# Patient Record
Sex: Female | Born: 1995 | Race: Black or African American | Hispanic: No | Marital: Single | Smoking: Never smoker
Health system: Southern US, Community
[De-identification: ages and names within clinical notes are randomized; demographics above are authoritative.]

## PROBLEM LIST (undated history)

## (undated) DIAGNOSIS — Z789 Other specified health status: Secondary | ICD-10-CM

## (undated) HISTORY — PX: NO PAST SURGERIES: SHX2092

## (undated) HISTORY — PX: WISDOM TOOTH EXTRACTION: SHX21

---

## 2013-12-13 ENCOUNTER — Emergency Department: Payer: Self-pay | Admitting: Emergency Medicine

## 2013-12-13 LAB — COMPREHENSIVE METABOLIC PANEL
ALBUMIN: 3.4 g/dL — AB (ref 3.8–5.6)
ALK PHOS: 51 U/L
ALT: 17 U/L (ref 12–78)
Anion Gap: 14 (ref 7–16)
BUN: 11 mg/dL (ref 7–18)
Bilirubin,Total: 0.3 mg/dL (ref 0.2–1.0)
CHLORIDE: 106 mmol/L (ref 98–107)
Calcium, Total: 8.5 mg/dL — ABNORMAL LOW (ref 9.0–10.7)
Co2: 27 mmol/L (ref 21–32)
Creatinine: 0.7 mg/dL (ref 0.60–1.30)
GLUCOSE: 92 mg/dL (ref 65–99)
Osmolality: 291 (ref 275–301)
POTASSIUM: 3.7 mmol/L (ref 3.5–5.1)
SGOT(AST): 25 U/L (ref 0–26)
SODIUM: 147 mmol/L — AB (ref 136–145)
TOTAL PROTEIN: 7.1 g/dL (ref 6.4–8.6)

## 2013-12-13 LAB — CBC
HCT: 33.5 % — ABNORMAL LOW (ref 35.0–47.0)
HGB: 11.5 g/dL — ABNORMAL LOW (ref 12.0–16.0)
MCH: 30.2 pg (ref 26.0–34.0)
MCHC: 34.4 g/dL (ref 32.0–36.0)
MCV: 88 fL (ref 80–100)
Platelet: 252 10*3/uL (ref 150–440)
RBC: 3.82 10*6/uL (ref 3.80–5.20)
RDW: 13.8 % (ref 11.5–14.5)
WBC: 8.2 10*3/uL (ref 3.6–11.0)

## 2013-12-13 LAB — URINALYSIS, COMPLETE
Bilirubin,UR: NEGATIVE
GLUCOSE, UR: NEGATIVE mg/dL (ref 0–75)
Ketone: NEGATIVE
NITRITE: NEGATIVE
PH: 5 (ref 4.5–8.0)
Protein: 30
Specific Gravity: 1.025 (ref 1.003–1.030)
WBC UR: 14 /HPF (ref 0–5)

## 2013-12-13 LAB — GC/CHLAMYDIA PROBE AMP

## 2013-12-13 LAB — WET PREP, GENITAL

## 2013-12-13 LAB — HCG, QUANTITATIVE, PREGNANCY: Beta Hcg, Quant.: 621 m[IU]/mL — ABNORMAL HIGH

## 2014-10-27 ENCOUNTER — Emergency Department: Payer: Self-pay | Admitting: Emergency Medicine

## 2015-02-06 IMAGING — US US OB < 14 WEEKS - US OB TV
1 series · 14 of 28 positions shown · non-contrast
Comparison: None.

CLINICAL DATA: Abortion last week. Pelvic pain. Evaluate for
retained products of conception.

EXAM:
OBSTETRIC <14 WK US AND TRANSVAGINAL OB US
TECHNIQUE: Both transabdominal and transvaginal ultrasound examinations were
performed for complete evaluation of the gestation as well as the
maternal uterus, adnexal regions, and pelvic cul-de-sac.
Transvaginal technique was performed to assess early pregnancy.

[Series 1: us ob < 14 weeks - us ob tv · 0.30mm/px · 14 of 123 slices shown]
[im 5/123]
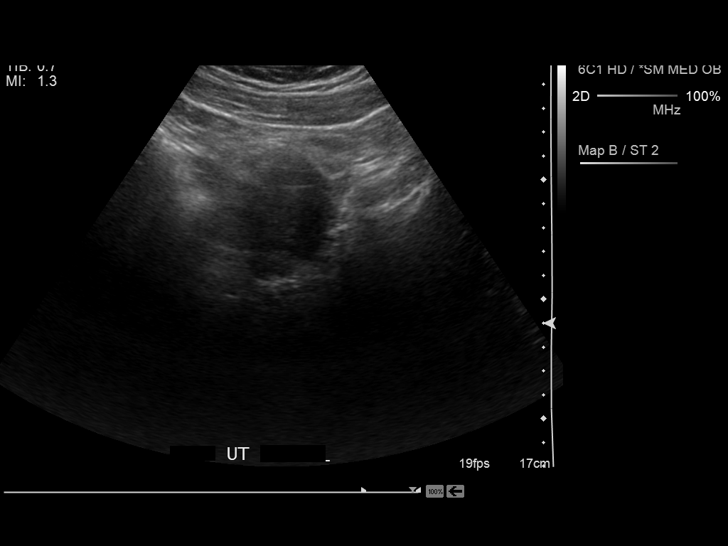
[im 14/123]
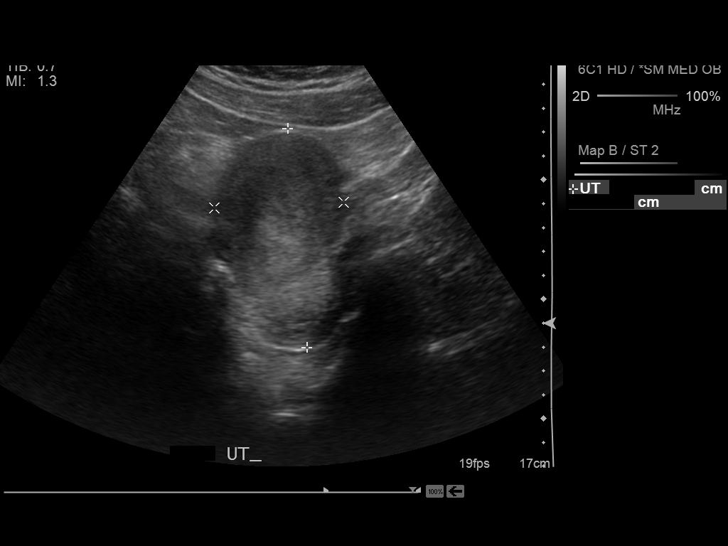
[im 23/123]
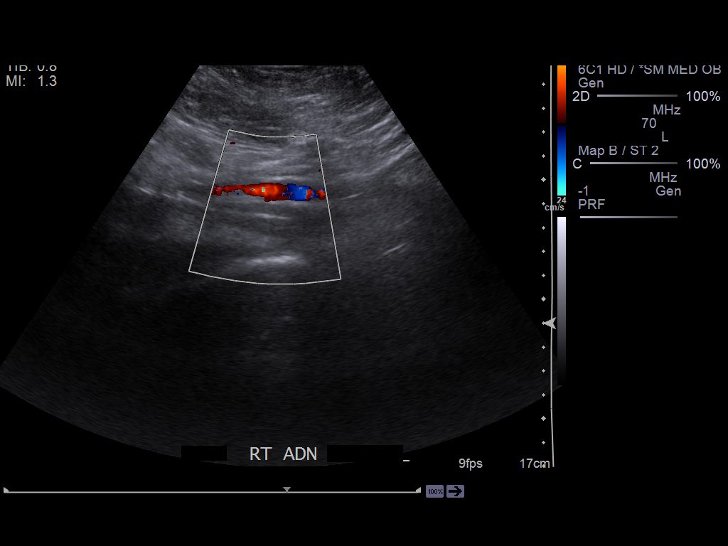
[im 32/123]
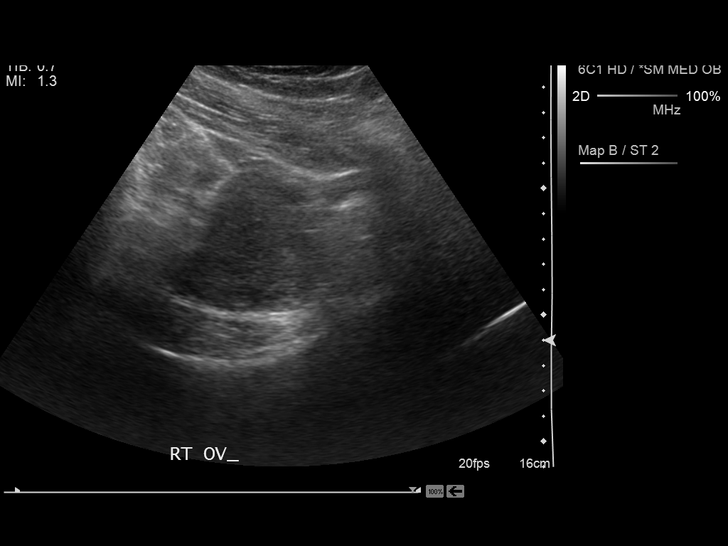
[im 41/123]
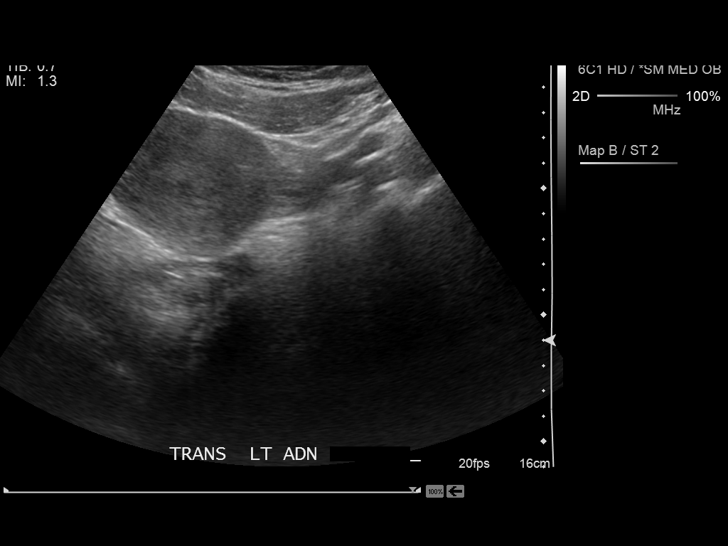
[im 50/123]
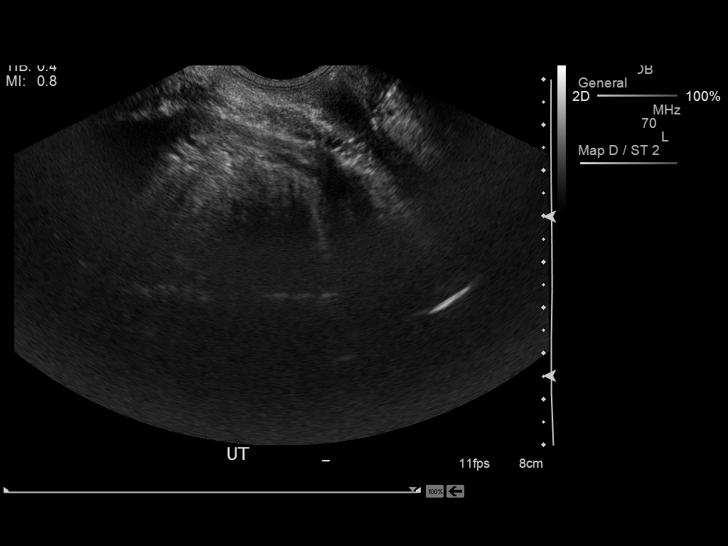
[im 59/123]
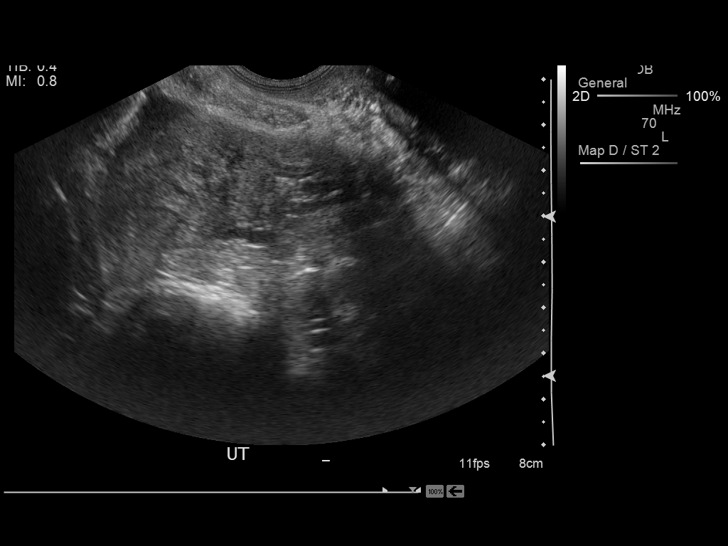
[im 68/123]
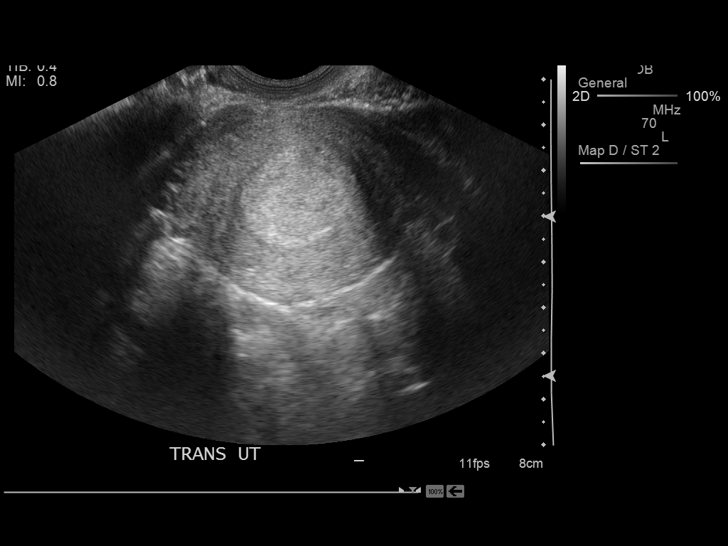
[im 77/123]
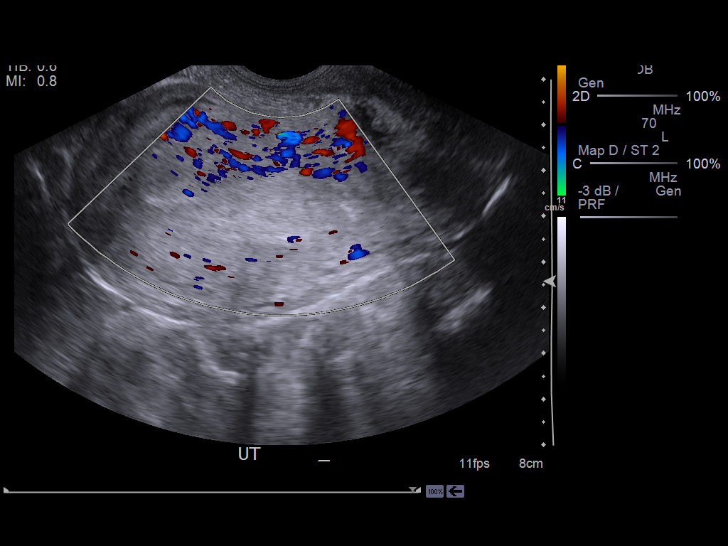
[im 86/123]
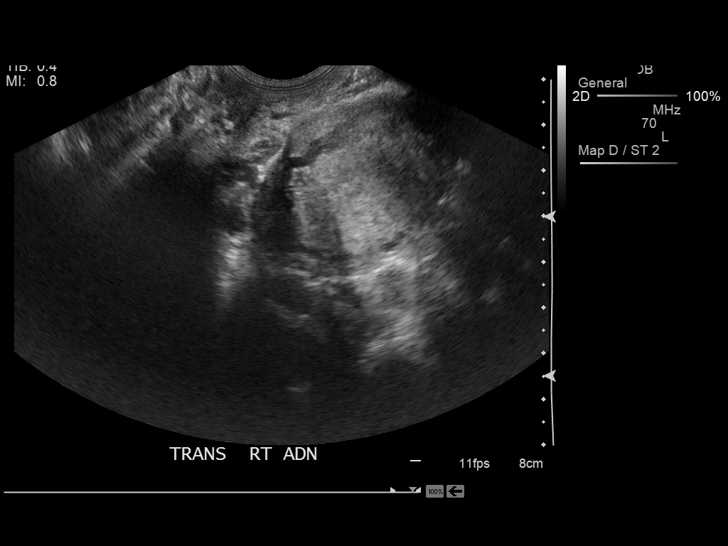
[im 95/123]
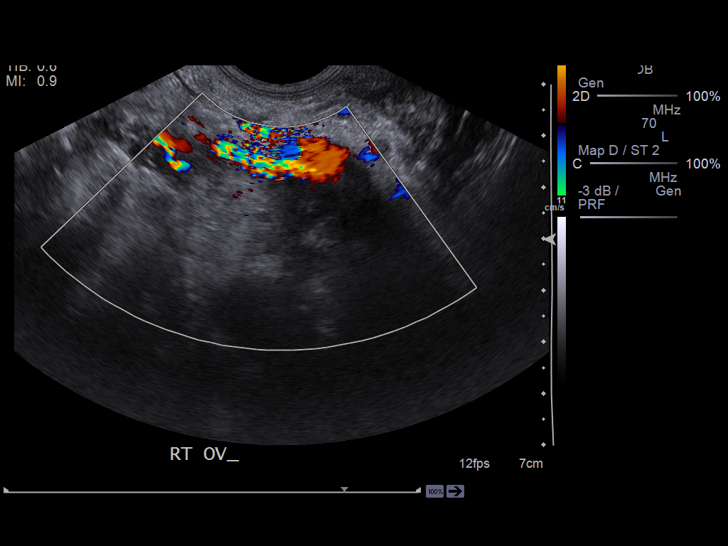
[im 104/123]
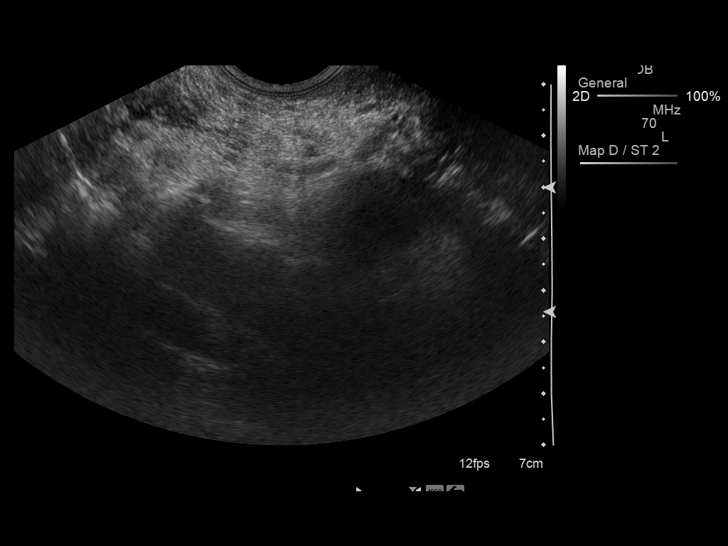
[im 113/123]
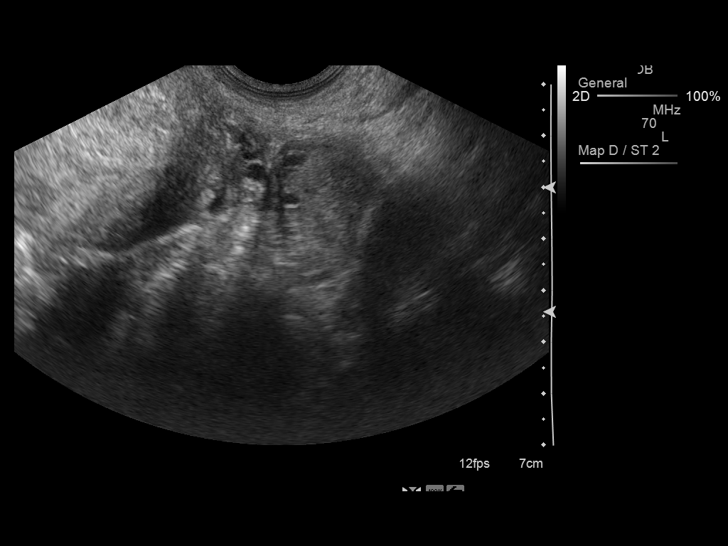
[im 123/123]
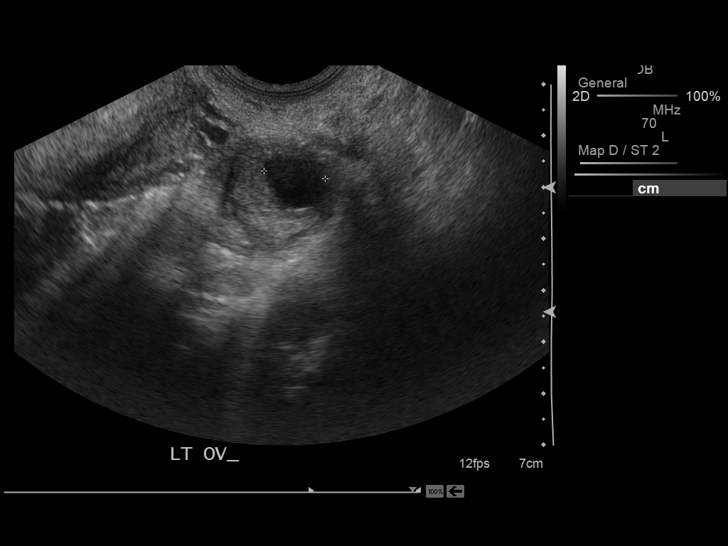

[14 of 28 positions shown; findings below may reference images not displayed]

FINDINGS: Intrauterine gestational sac: Absent

Yolk sac:  Not visualized

Embryo:  Not visualized

Maternal uterus/adnexae: Endometrium is thickened and heterogeneous.
Internal blood flow noted within the endometrium. Findings
concerning for retained products of conception. Endometrial
thickness 18 mm.

Ovaries are symmetric in size and echotexture. No adnexal masses.
Trace free fluid in the cul-de-sac.
IMPRESSION: No intrauterine pregnancy.

Thickened, heterogeneous endometrium with internal blood flow.
Cannot exclude retained products of conception.

## 2015-09-06 ENCOUNTER — Encounter: Payer: Self-pay | Admitting: *Deleted

## 2015-09-06 ENCOUNTER — Emergency Department: Payer: Self-pay

## 2015-09-06 ENCOUNTER — Emergency Department
Admission: EM | Admit: 2015-09-06 | Discharge: 2015-09-06 | Disposition: A | Discharge status: Home / Self Care | Payer: Self-pay | Attending: Emergency Medicine | Admitting: Emergency Medicine

## 2015-09-06 DIAGNOSIS — G8929 Other chronic pain: Secondary | ICD-10-CM | POA: Insufficient documentation

## 2015-09-06 DIAGNOSIS — M25562 Pain in left knee: Secondary | ICD-10-CM | POA: Insufficient documentation

## 2015-09-06 MED ORDER — NAPROXEN SODIUM 275 MG PO TABS: 275.0000 mg | ORAL_TABLET | Freq: Two times a day (BID) | ORAL | Status: AC

## 2015-09-06 NOTE — ED Notes (Signed)
Left knee pain x several years, states that the pain worsened 2 days ago.  Ambulatory.

## 2015-09-06 NOTE — Discharge Instructions (Signed)

## 2015-09-06 NOTE — ED Notes (Signed)
Pt dc home ambulatory with crutches instructed on follow up and med use PT NAD AT DC

## 2015-09-06 NOTE — ED Provider Notes (Addendum)
[facility] Emergency Department Provider Note  ____________________________________________   I have reviewed the triage vital signs and the nursing notes.   HISTORY  Chief Complaint Knee Pain    HPI [name] is a 19 y.o. female with a unfortunate history of morbid obesity who has had knee pain for many years. She was once told she had [employer] synd[employer]ewho presents today complaining of a "flare" in her chronic pain in her left knee. She has had it x-rayed before. She has not fallen or had trauma. She works at [employer] standing up and sometimes her knee aches. She has had no fever or chills or systemic illness. This is the same pain she has had many times before. The joint is not red or hot or swollen.  History reviewed. No pertinent past medical history.  There are no active problems to display for this patient.   History reviewed. No pertinent past surgical history.  No current outpatient prescriptions on file.  Allergies Review of patient's allergies indicates no known allergies.  History reviewed. No pertinent family history.  Social History Social History  Substance Use Topics  . Smoking status: Never Smoker   . Smokeless tobacco: None  . Alcohol Use: No    Review of Systems Constitutional: No fever/chills Eyes: No visual changes. ENT: No sore throat. No stiff neck no neck pain Cardiovascular: Denies chest pain. Respiratory: Denies shortness of breath. Gastrointestinal:   no vomiting.  No diarrhea.  No constipation. Genitourinary: Negative for dysuria. Musculoskeletal: Negative lower extremity swelling Skin: Negative for rash. Neurological: Negative for headaches, focal weakness or numbness. 10-point ROS otherwise negative.  ____________________________________________   PHYSICAL EXAM:  VITAL SIGNS: ED Triage Vitals  Enc Vitals Group     BP 09/06/15 2020 122/76 mmHg     Pulse Rate 09/06/15 2020 86     Resp  09/06/15 2020 18     Temp 09/06/15 2020 98.5 F (36.9 C)     Temp Source 09/06/15 2020 Oral     SpO2 09/06/15 2020 99 %     Weight 09/06/15 2020 280 lb (127.007 kg)     Height 09/06/15 2020 5\' 5"  (1.651 m)     Head Cir --      Peak Flow --      Pain Score 09/06/15 2019 8     Pain Loc --      Pain Edu? --      Excl. in GC? --     Constitutional: Alert and oriented. Well appearing and in no acute distress. Back:  There is no focal tenderness or step off there is no midline tenderness there are no lesions noted. there is no CVA tenderness Musculoskeletal: No lower extremity tenderness. No joint effusions, no DVT signs strong distal pulses no edema  Left lower showed me shows minimal tenderness to palpation of the left knee with no obvious effusion, full painless range of motion, there is no joint instability on varus or valgus stress, but is not red or hot to touch or otherwise in appearance infected. There is no calf tenderness ankle pain or tenderness or hip pain or tenderness there is nothing to suggest occult fracture there is nothing to suggest joint infection there is nothing to suggest DVT Neurologic:  Normal speech and language. No gross focal neurologic deficits are appreciated.  Skin:  Skin is warm, dry and intact. No rash noted. Psychiatric: Mood and affect are normal. Speech and behavior are normal.  ____________________________________________   LABS (  all labs ordered are listed, but only abnormal results are displayed)  Labs Reviewed - No data to display ____________________________________________  EKG  I personally interpreted any EKGs ordered by me or triage  ____________________________________________  RADIOLOGY  I reviewed any imaging ordered by me or triage that were performed during my shift ____________________________________________   PROCEDURES  Procedure(s) performed: None  Critical Care performed:  None  ____________________________________________   INITIAL IMPRESSION / ASSESSMENT AND PLAN / ED COURSE  Pertinent labs & imaging results that were available during my care of the patient were reviewed by me and considered in my medical decision making (see chart for details).  Patient with chronic knee pain, I do not think a knee immobilizer would help her given there is no instability or injury, I've advised her to lose weight as I think that may help her pain, there is nothing at this time to suggest infected joint DVT or gout. This is not in any way that I can detect referred pain from her hip or other sources. We will advise nostril pain medication I'll give her a note for work and we will try an Ace bandage to see if that helps her. Return precautions and follow-up given and understood.  ----------------------------------------- 9:44 PM on 09/06/2015 -----------------------------------------  Refuses crutches ____________________________________________   FINAL CLINICAL IMPRESSION(S) / ED DIAGNOSES  Final diagnoses:  None     Jeanmarie Plant, MD 09/06/15 2134  Jeanmarie Plant, MD 09/06/15 2144

## 2016-10-30 IMAGING — CR DG KNEE COMPLETE 4+V*L*
1 series · 4 of 4 positions shown · non-contrast
Comparison: None.

CLINICAL DATA: Acute onset of left knee pain.  Initial encounter.

EXAM:
LEFT KNEE - COMPLETE 4+ VIEW

[Series 1: t knee ap left · 0.14mm/px · 4 of 4 slices shown]
[im 1/4]
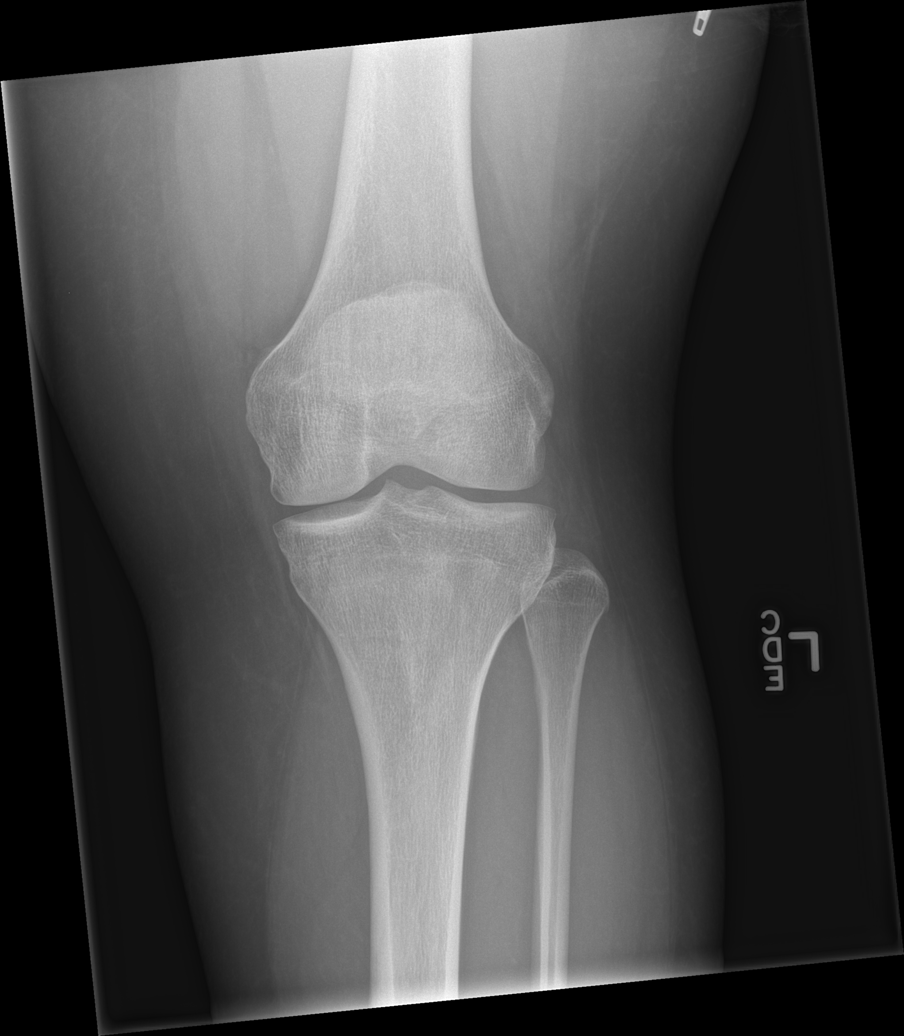
[im 2/4]
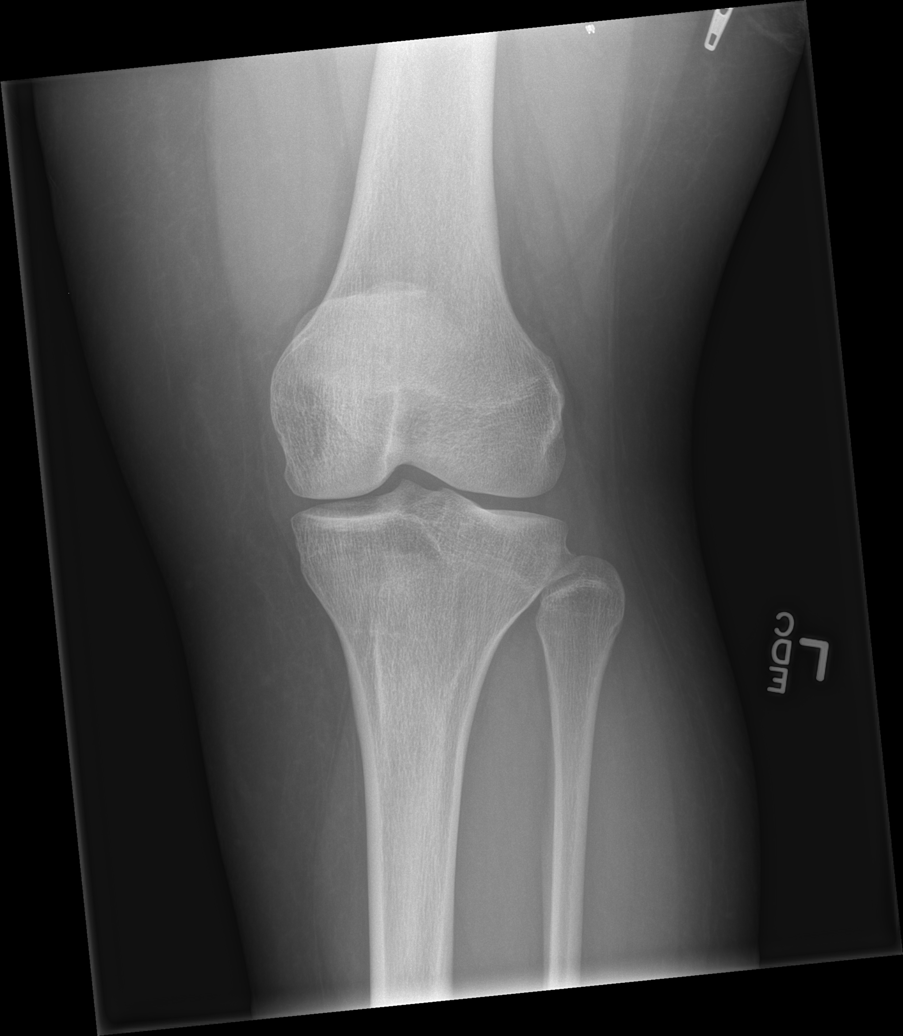
[im 3/4]
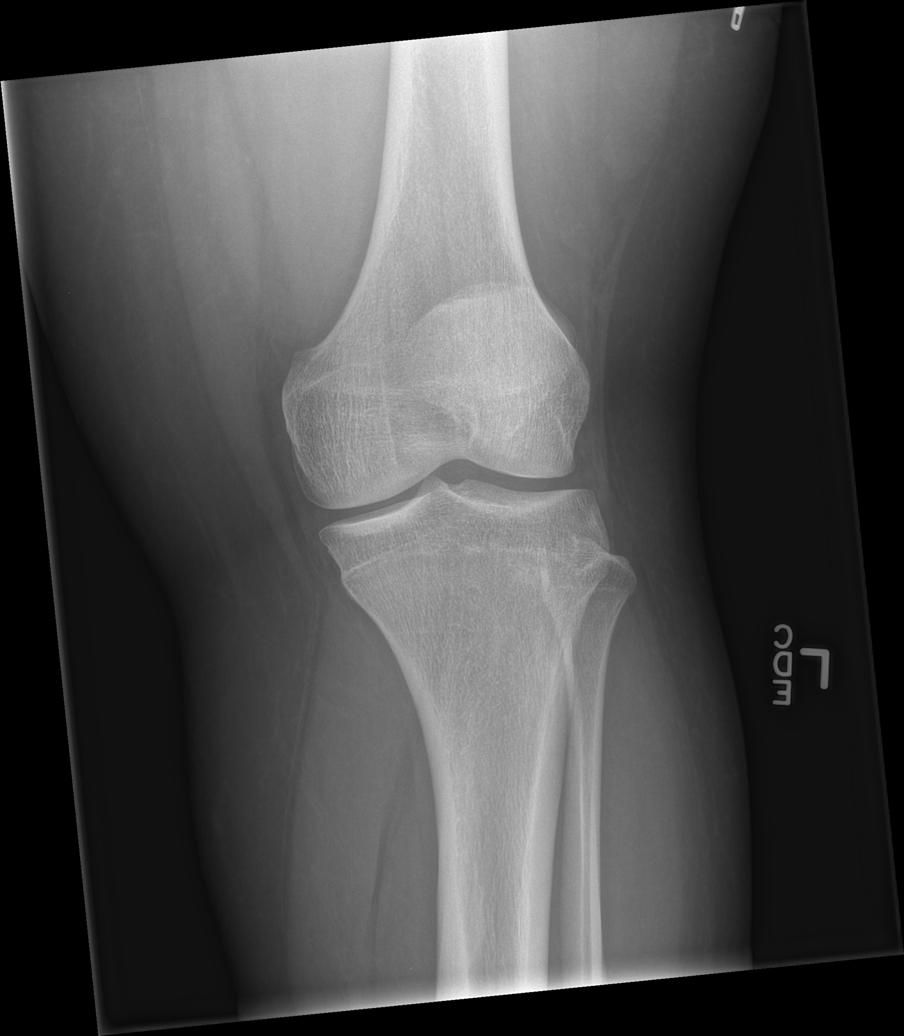
[im 4/4]
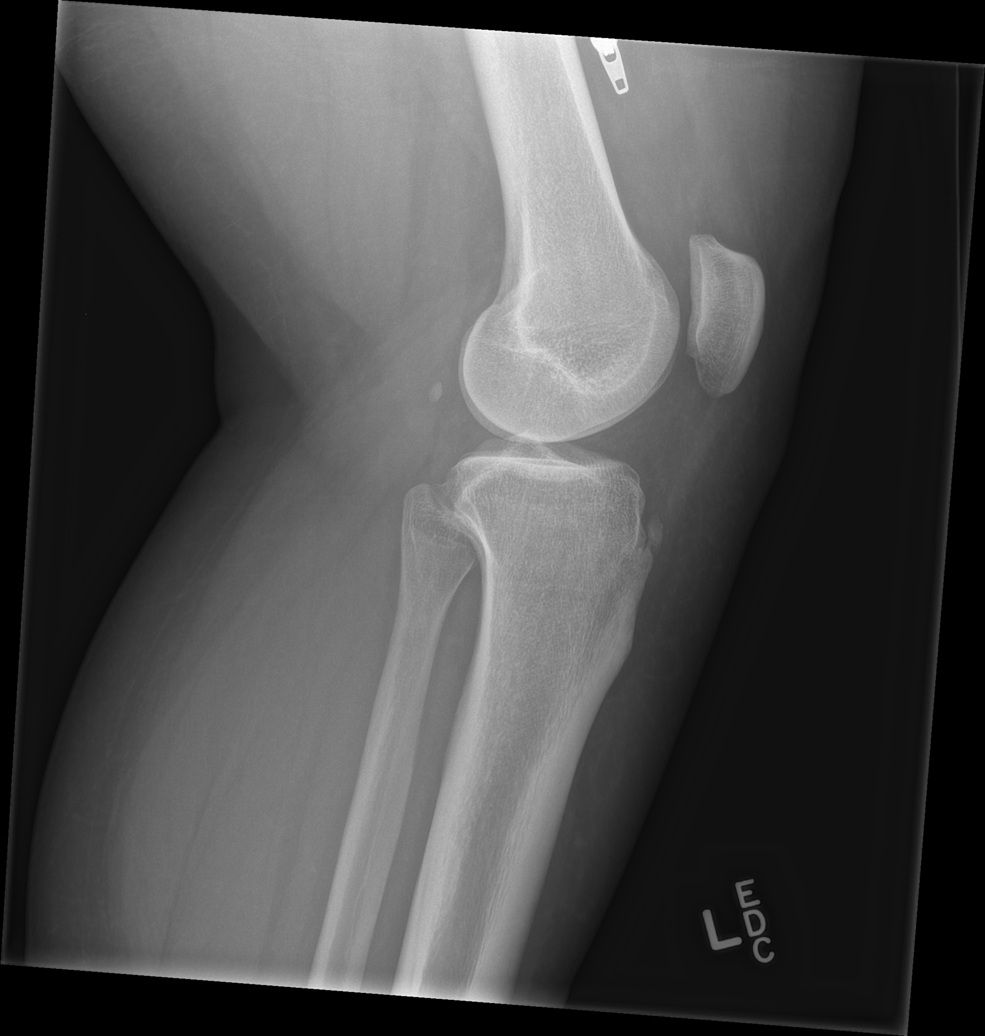

[4 of 4 positions shown; findings below may reference images not displayed]

FINDINGS: There is no evidence of fracture or dislocation. The joint spaces
are preserved. No significant degenerative change is seen; the
patellofemoral joint is grossly unremarkable in appearance. A
fabella is noted. An accessory ossicle is seen at the distal
patellar tendon.

No significant joint effusion is seen. The visualized soft tissues
are normal in appearance.
IMPRESSION: No evidence of fracture or dislocation.

## 2016-12-12 ENCOUNTER — Encounter: Payer: Self-pay | Admitting: Emergency Medicine

## 2016-12-12 ENCOUNTER — Emergency Department
Admission: EM | Admit: 2016-12-12 | Discharge: 2016-12-12 | Disposition: A | Discharge status: Home / Self Care | Payer: Self-pay | Attending: Student in an Organized Health Care Education/Training Program | Admitting: Student in an Organized Health Care Education/Training Program

## 2016-12-12 DIAGNOSIS — H6123 Impacted cerumen, bilateral: Secondary | ICD-10-CM | POA: Insufficient documentation

## 2016-12-12 NOTE — Discharge Instructions (Signed)
Today, your ears were flushed with normal saline and disimpacted successfully. Please avoid Q-tips and flush your ears several times a week with warm water and/or hydrogen peroxide. Return to the ER for any fevers worsening symptoms urgent changes in her health.

## 2016-12-12 NOTE — ED Triage Notes (Signed)
Increasing hearing loss both ears x 2 days.

## 2016-12-12 NOTE — ED Provider Notes (Signed)
[facility]-EMERGENCY DEPARTMENT Provider Note   CSN: [id number] Arrival date & time: 12/12/16  1644     History   Chief Complaint Chief Complaint  Patient presents with  . Hearing Loss    HPI Sim BoastShalena Castro is a 21 y.o. female presents to the emergency department for evaluation of bilateral ear hearing loss. Symptoms began 2 days ago. She denies any congestion, runny nose, fevers, pain, headaches, vision changes, dizziness. She denies any drainage throughout both the ears. She feels well but has difficulty hearing out of both ears.  HPI  History reviewed. No pertinent past medical history.  There are no active problems to display for this patient.   History reviewed. No pertinent surgical history.  OB History    No data available       Home Medications    Prior to Admission medications   Not on File    Family History No family history on file.  Social History Social History  Substance Use Topics  . Smoking status: Never Smoker  . Smokeless tobacco: Not on file  . Alcohol use No     Allergies   Patient has no known allergies.   Review of Systems Review of Systems  Constitutional: Negative for activity change, chills, fatigue and fever.  HENT: Positive for hearing loss. Negative for congestion, sinus pressure and sore throat.   Eyes: Negative for visual disturbance.  Respiratory: Negative for cough, chest tightness and shortness of breath.   Gastrointestinal: Negative for abdominal pain, diarrhea, nausea and vomiting.  Genitourinary: Negative for dysuria.  Musculoskeletal: Negative for arthralgias and gait problem.  Skin: Negative for rash.  Neurological: Negative for weakness, numbness and headaches.  Hematological: Negative for adenopathy.  Psychiatric/Behavioral: Negative for agitation, behavioral problems and confusion.     Physical Exam Updated Vital Signs BP 131/82   Pulse 82   Temp 97.8 F (36.6 C) (Oral)   Resp 20   Ht 5\' 5"  (1.651 m)    Wt 132.5 kg   LMP 12/05/2016   SpO2 100%   BMI 48.59 kg/m   Physical Exam  Constitutional: She is oriented to person, place, and time. She appears well-developed and well-nourished. No distress.  HENT:  Head: Normocephalic and atraumatic.  Right Ear: No drainage. Tympanic membrane is not injected, not perforated and not erythematous. Decreased hearing is noted.  Left Ear: No drainage. Tympanic membrane is not injected, not perforated and not erythematous. Decreased hearing is noted.  Mouth/Throat: Oropharynx is clear and moist.  Bilateral ear cerumen impactions, complete occlusion with no signs of any infection.  Eyes: EOM are normal. Pupils are equal, round, and reactive to light. Right eye exhibits no discharge. Left eye exhibits no discharge.  Neck: Normal range of motion. Neck supple.  Cardiovascular: Normal rate.   Pulmonary/Chest: Effort normal. No respiratory distress.  Musculoskeletal: Normal range of motion. She exhibits no edema.  Neurological: She is alert and oriented to person, place, and time. She has normal reflexes.  Skin: Skin is warm and dry.  Psychiatric: She has a normal mood and affect. Her behavior is normal. Thought content normal.     ED Treatments / Results  Labs (all labs ordered are listed, but only abnormal results are displayed) Labs Reviewed - No data to display  EKG  EKG Interpretation None       Radiology No results found.  Procedures Procedures (including critical care time) Left and right ears noted to have cerumen impactions. Ceruminosis is noted.  Wax is removed bilaterally  by syringing and manual debridement. Instructions for home care to prevent wax buildup are given.    Medications Ordered in ED Medications - No data to display   Initial Impression / Assessment and Plan / ED Course  I have reviewed the triage vital signs and the nursing notes.  Pertinent labs & imaging results that were available during my care of the  patient were reviewed by me and considered in my medical decision making (see chart for details).     21 year old female with bilateral cerumen impaction. Both ears completely disimpacted cerumen removed. There is no signs of infection. Both tympanic membranes visualized and appear to be normal. She is shown how to properly cleansed the ears  Final Clinical Impressions(s) / ED Diagnoses   Final diagnoses:  Bilateral hearing loss due to cerumen impaction    New Prescriptions New Prescriptions   No medications on file     Evon Slack, PA-C 12/12/16 1743    Willy Eddy, MD 12/12/16 [phone]

## 2018-08-15 ENCOUNTER — Emergency Department
Admission: EM | Admit: 2018-08-15 | Discharge: 2018-08-15 | Disposition: A | Discharge status: Home / Self Care | Payer: Self-pay | Attending: Emergency Medicine | Admitting: Emergency Medicine

## 2018-08-15 ENCOUNTER — Encounter: Payer: Self-pay | Admitting: Emergency Medicine

## 2018-08-15 ENCOUNTER — Other Ambulatory Visit: Payer: Self-pay

## 2018-08-15 DIAGNOSIS — J069 Acute upper respiratory infection, unspecified: Secondary | ICD-10-CM | POA: Insufficient documentation

## 2018-08-15 MED ORDER — PSEUDOEPH-BROMPHEN-DM 30-2-10 MG/5ML PO SYRP: 5.0000 mL | ORAL_SOLUTION | Freq: Four times a day (QID) | ORAL | 0 refills | Status: DC | PRN

## 2018-08-15 NOTE — ED Provider Notes (Signed)
[facility] Emergency Department Provider Note  ____________________________________________   First MD Initiated Contact with Patient 08/15/18 1624     (approximate)  I have reviewed the triage vital signs and the nursing notes.   HISTORY  Chief Complaint Nasal Congestion   HPI [name] is a 22 y.o. female presents to the ED with complaint of nasal congestion for the last 3 days.  There is been no fever, chills, nausea or vomiting.  Patient has taken Tylenol with minimal relief.  Patient continues to eat and drink as normal.  She rates her pain as an 8 out of 10.   History reviewed. No pertinent past medical history.  There are no active problems to display for this patient.   History reviewed. No pertinent surgical history.  Prior to Admission medications   Medication Sig Start Date End Date Taking? Authorizing Provider  brompheniramine-pseudoephedrine-DM 30-2-10 MG/5ML syrup Take 5 mLs by mouth 4 (four) times daily as needed. 08/15/18   Tommi Rumps, PA-C    Allergies Patient has no known allergies.  No family history on file.  Social History Social History   Tobacco Use  . Smoking status: Never Smoker  Substance Use Topics  . Alcohol use: No  . Drug use: No    Review of Systems Constitutional: No fever/chills Eyes: No visual changes. ENT: No sore throat.  Positive nasal congestion. Cardiovascular: Denies chest pain. Respiratory: Denies shortness of breath.  Positive cough. Gastrointestinal: No abdominal pain.  No nausea, no vomiting.  No diarrhea.  No constipation. Musculoskeletal: Negative for back pain. Skin: Negative for rash. Neurological: Negative for headaches, focal weakness or numbness. ____________________________________________   PHYSICAL EXAM:  VITAL SIGNS: ED Triage Vitals [08/15/18 1545]  Enc Vitals Group     BP 111/63     Pulse Rate 70     Resp 18     Temp 98.1 F (36.7 C)     Temp Source  Oral     SpO2 98 %     Weight 285 lb (129.3 kg)     Height 5\' 5"  (1.651 m)     Head Circumference      Peak Flow      Pain Score 8     Pain Loc      Pain Edu?      Excl. in GC?    Constitutional: Alert and oriented. Well appearing and in no acute distress. Eyes: Conjunctivae are normal. PERRL. EOMI. Head: Atraumatic. Nose: Moderate congestion/clear rhinnorhea.  EACs and TMs clear bilaterally. Mouth/Throat: Mucous membranes are moist.  Oropharynx non-erythematous.  Uvula is midline. Neck: No stridor.   Hematological/Lymphatic/Immunilogical: No cervical lymphadenopathy. Cardiovascular: Normal rate, regular rhythm. Grossly normal heart sounds.  Good peripheral circulation. Respiratory: Normal respiratory effort.  No retractions. Lungs CTAB. Gastrointestinal: Soft and nontender. No distention.  Musculoskeletal: Moves upper and lower extremities then difficulty normal gait was noted. Neurologic:  Normal speech and language. No gross focal neurologic deficits are appreciated. No gait instability. Skin:  Skin is warm, dry and intact. No rash noted. Psychiatric: Mood and affect are normal. Speech and behavior are normal.  ____________________________________________   LABS (all labs ordered are listed, but only abnormal results are displayed)  Labs Reviewed - No data to display PROCEDURES  Procedure(s) performed: None  Procedures  Critical Care performed: No  ____________________________________________   INITIAL IMPRESSION / ASSESSMENT AND PLAN / ED COURSE  As part of my medical decision making, I reviewed the following data within the  electronic MEDICAL RECORD NUMBER Notes from prior ED visits and [place] Controlled Substance Database  Patient presents with complaint of nasal congestion for the last 3 days.  Patient has been taking Tylenol without any relief.  She denies any fever, chills, nausea or vomiting.  Physical exam is consistent with a upper respiratory infection.  Patient was  given a prescription for Bromfed-DM to take 4 times daily as needed.  She is to follow-up with [facility] acute care if any continued problems. ____________________________________________   FINAL CLINICAL IMPRESSION(S) / ED DIAGNOSES  Final diagnoses:  Upper respiratory tract infection, unspecified type     ED Discharge Orders         Ordered    brompheniramine-pseudoephedrine-DM 30-2-10 MG/5ML syrup  4 times daily PRN     08/15/18 1640           Note:  This document was prepared using Dragon voice recognition software and may include unintentional dictation errors.    Tommi Rumps, PA-C 08/15/18 1704    Nita Sickle, MD 08/16/18 [phone]

## 2018-08-15 NOTE — Discharge Instructions (Signed)
Follow-up with [facility] acute care if any continued problems.  Begin taking Bromfed-DM as needed for cough, congestion and nasal drainage.  Increase fluids.  You may also take Tylenol or ibuprofen if needed for throat pain or ear pain.

## 2018-08-15 NOTE — ED Triage Notes (Signed)
Congestion x 3 days

## 2018-11-06 ENCOUNTER — Other Ambulatory Visit: Payer: Self-pay

## 2018-11-06 ENCOUNTER — Emergency Department
Admission: EM | Admit: 2018-11-06 | Discharge: 2018-11-06 | Disposition: A | Discharge status: Home / Self Care | Payer: Self-pay | Attending: Emergency Medicine | Admitting: Emergency Medicine

## 2018-11-06 DIAGNOSIS — J020 Streptococcal pharyngitis: Secondary | ICD-10-CM | POA: Insufficient documentation

## 2018-11-06 LAB — GROUP A STREP BY PCR: Group A Strep by PCR: DETECTED — AB

## 2018-11-06 MED ORDER — FLUCONAZOLE 150 MG PO TABS: 150.0000 mg | ORAL_TABLET | Freq: Once | ORAL | 0 refills | Status: AC

## 2018-11-06 MED ORDER — AMOXICILLIN 500 MG PO CAPS
500.0000 mg | ORAL_CAPSULE | Freq: Once | ORAL | Status: AC
  Administered 2018-11-06: 500 mg via ORAL
  Filled 2018-11-06: qty 1

## 2018-11-06 MED ORDER — AMOXICILLIN 500 MG PO TABS: 500.0000 mg | ORAL_TABLET | Freq: Three times a day (TID) | ORAL | 0 refills | Status: DC

## 2018-11-06 NOTE — ED Notes (Signed)
See triage note  Presents with sore throat  Pain started couple of days ago  Increased pain with swallowing  No fever

## 2018-11-06 NOTE — ED Provider Notes (Signed)
[facility] Emergency Department Provider Note  ____________________________________________  Time seen: Approximately 10:32 PM  I have reviewed the triage vital signs and the nursing notes.   HISTORY  Chief Complaint Sore Throat    HPI [name] is a 23 y.o. female presents to the emergency department for treatment and evaluation of  sore throat that started a couple of days ago.  She has had an increase in pain today.  She denies fever or other symptoms of concern.  No alleviating measures attempted.  History reviewed. No pertinent past medical history.  There are no active problems to display for this patient.   History reviewed. No pertinent surgical history.  Prior to Admission medications   Medication Sig Start Date End Date Taking? Authorizing Provider  amoxicillin (AMOXIL) 500 MG tablet Take 1 tablet (500 mg total) by mouth 3 (three) times daily. 11/06/18   Aloys Hupfer, Rulon Eisenmenger B, FNP  fluconazole (DIFLUCAN) 150 MG tablet Take 1 tablet (150 mg total) by mouth once for 1 dose. 11/06/18 11/06/18  Chinita Pester, FNP    Allergies Patient has no known allergies.  No family history on file.  Social History Social History   Tobacco Use  . Smoking status: Never Smoker  Substance Use Topics  . Alcohol use: No  . Drug use: No    Review of Systems Constitutional: Negative for fever. Eyes: No visual changes. ENT: Positive for sore throat; negative for difficulty swallowing. Respiratory: Denies shortness of breath. Gastrointestinal: Negative for abdominal pain.  No nausea, no vomiting.  No diarrhea.  Genitourinary: Negative for dysuria.  Negative for decrease in need to void. Musculoskeletal: Negative for generalized body aches. Skin: Negative for rash. Neurological: Negative for headaches, negative for focal weakness or numbness.  ____________________________________________   PHYSICAL EXAM:  VITAL SIGNS: ED Triage Vitals  Enc Vitals  Group     BP 11/06/18 1731 113/75     Pulse Rate 11/06/18 1731 85     Resp 11/06/18 1731 18     Temp 11/06/18 1731 98.3 F (36.8 C)     Temp src --      SpO2 11/06/18 1731 100 %     Weight 11/06/18 1732 285 lb (129.3 kg)     Height 11/06/18 1732 5\' 6"  (1.676 m)     Head Circumference --      Peak Flow --      Pain Score 11/06/18 1732 8     Pain Loc --      Pain Edu? --      Excl. in GC? --     Constitutional: Alert and oriented. Well appearing and in no acute distress. Eyes: Conjunctivae are normal.  Head: Atraumatic. Nose: No congestion/rhinnorhea. Mouth/Throat: Mucous membranes are moist.  Oropharynx erythematous, tonsils 2+ with exudate. Uvula is midline. Ears: Right tympanic membrane appears normal.  Left tympanic membrane appears normal. Neck: No stridor. Voice clear Lymphatic: Anterior cervical nodes tender and palpable bilaterally Cardiovascular: Normal rate, regular rhythm. Good peripheral circulation. Respiratory: Normal respiratory effort. Lungs CTAB. Gastrointestinal: Soft and nontender. Musculoskeletal: FROM of neck, upper and lower extremities. Neurologic:  Normal speech and language. No gross focal neurologic deficits are appreciated. Skin:  Skin is warm, dry and intact.  No rash noted Psychiatric: Mood and affect are normal. Speech and behavior are normal.  ____________________________________________   LABS (all labs ordered are listed, but only abnormal results are displayed)  Labs Reviewed  GROUP A STREP BY PCR - Abnormal; Notable for the following  components:      Result Value   Group A Strep by PCR DETECTED (*)    All other components within normal limits   ____________________________________________  EKG  Not indiated ____________________________________________  RADIOLOGY  Not indicated ____________________________________________   PROCEDURES  Procedure(s) performed: None  Critical Care performed:  No ____________________________________________   INITIAL IMPRESSION / ASSESSMENT AND PLAN / ED COURSE  23 year old female presenting to the emergency department for treatment and evaluation of sore throat.  Strep by PCR testing is positive.  She will be treated with amoxicillin and is to take Tylenol or ibuprofen if needed for pain.  She also requested Diflucan in case she gets yeast infection secondary to the antibiotics which was provided.  She was encouraged to follow-up with her primary care provider of her choice for symptoms that are not improving over the next few days.  She was encouraged to return to the emergency department for symptoms of change or worsen if unable to schedule appointment.  Pertinent labs & imaging results that were available during my care of the patient were reviewed by me and considered in my medical decision making (see chart for details). ____________________________________________  Discharge Medication List as of 11/06/2018  7:14 PM    START taking these medications   Details  amoxicillin (AMOXIL) 500 MG tablet Take 1 tablet (500 mg total) by mouth 3 (three) times daily., Starting Sun 11/06/2018, Normal    fluconazole (DIFLUCAN) 150 MG tablet Take 1 tablet (150 mg total) by mouth once for 1 dose., Starting Sun 11/06/2018, Normal        FINAL CLINICAL IMPRESSION(S) / ED DIAGNOSES  Final diagnoses:  Strep throat    If controlled substance prescribed during this visit, 12 month history viewed on the [facility] prior to issuing an initial prescription for Schedule II or III opiod.   Note:  This document was prepared using Dragon voice recognition software and may include unintentional dictation errors.    Chinita Pesterriplett, Emalie Mcwethy B, FNP 11/06/18 2234    Sharman CheekStafford, Phillip, MD 11/07/18 [phone]

## 2018-11-06 NOTE — ED Triage Notes (Signed)
Pt comes via POV from home with c/o sore throat. Pt states it hurts to swallow and is having to spit.  Pt states this started on Friday and has gotten worse.

## 2018-11-06 NOTE — Discharge Instructions (Signed)
Follow up with the primary care provider of your choice for symptoms that are not improving over the next few days.  Return to the ER for symptoms that change or worsen if unable to schedule an appointment. 

## 2019-05-29 ENCOUNTER — Other Ambulatory Visit: Payer: Self-pay

## 2019-05-29 DIAGNOSIS — Z20822 Contact with and (suspected) exposure to covid-19: Secondary | ICD-10-CM

## 2019-05-30 LAB — NOVEL CORONAVIRUS, NAA: SARS-CoV-2, NAA: NOT DETECTED

## 2020-04-12 ENCOUNTER — Other Ambulatory Visit: Payer: Self-pay

## 2020-04-12 ENCOUNTER — Emergency Department
Admission: EM | Admit: 2020-04-12 | Discharge: 2020-04-12 | Disposition: A | Discharge status: Home / Self Care | Payer: 59 | Attending: Emergency Medicine | Admitting: Emergency Medicine

## 2020-04-12 ENCOUNTER — Encounter: Payer: Self-pay | Admitting: Emergency Medicine

## 2020-04-12 ENCOUNTER — Emergency Department: Payer: 59

## 2020-04-12 DIAGNOSIS — S6992XA Unspecified injury of left wrist, hand and finger(s), initial encounter: Secondary | ICD-10-CM | POA: Diagnosis present

## 2020-04-12 DIAGNOSIS — X503XXA Overexertion from repetitive movements, initial encounter: Secondary | ICD-10-CM | POA: Diagnosis not present

## 2020-04-12 DIAGNOSIS — Y999 Unspecified external cause status: Secondary | ICD-10-CM | POA: Diagnosis not present

## 2020-04-12 DIAGNOSIS — Y939 Activity, unspecified: Secondary | ICD-10-CM | POA: Diagnosis not present

## 2020-04-12 DIAGNOSIS — Y929 Unspecified place or not applicable: Secondary | ICD-10-CM | POA: Insufficient documentation

## 2020-04-12 MED ORDER — NAPROXEN 375 MG PO TABS: 375.0000 mg | ORAL_TABLET | Freq: Two times a day (BID) | ORAL | 0 refills | Status: DC

## 2020-04-12 NOTE — Discharge Instructions (Addendum)
Follow discharge care instructions and wear wrist support while working for the next 7 to 10 days.  Take medication as directed.  Follow orthopedic if no improvement in 2 weeks.

## 2020-04-12 NOTE — ED Notes (Signed)
See triage note Presents with pain to left 2nd,3rd and 4 th digit  Pain states about 2-3 days ago  No injury  No swelling

## 2020-04-12 NOTE — ED Provider Notes (Signed)
[facility] Emergency Department Provider Note   ____________________________________________   First MD Initiated Contact with Patient 04/12/20 [phone]     (approximate)  I have reviewed the triage vital signs and the nursing notes.   HISTORY  Chief Complaint Hand Pain    HPI [name] is a 24 y.o. female patient complain of pain left hand pain for 2 days.  No specific injury.  Patient does have a job requiring repetitive motions of the upper extremities.  Patient is right-hand dominant.  Patient also states pain with palpation of the third metacarpal head.  Patient describes the pain as a "tingling sensation".  Rates pain as 8/10.  No palliative measure for complaint.         History reviewed. No pertinent past medical history.  There are no problems to display for this patient.   History reviewed. No pertinent surgical history.  Prior to Admission medications   Medication Sig Start Date End Date Taking? Authorizing Provider  naproxen (NAPROSYN) 375 MG tablet Take 1 tablet (375 mg total) by mouth 2 (two) times daily with a meal. 04/12/20   Joni Reining, PA-C    Allergies Patient has no known allergies.  No family history on file.  Social History Social History   Tobacco Use  . Smoking status: Never Smoker  Substance Use Topics  . Alcohol use: No  . Drug use: No    Review of Systems Constitutional: No fever/chills Eyes: No visual changes. ENT: No sore throat. Cardiovascular: Denies chest pain. Respiratory: Denies shortness of breath. Gastrointestinal: No abdominal pain.  No nausea, no vomiting.  No diarrhea.  No constipation. Genitourinary: Negative for dysuria. Musculoskeletal: Right hand pain.   Skin: Negative for rash. Neurological: Negative for headaches, focal weakness or numbness.   ____________________________________________   PHYSICAL EXAM:  VITAL SIGNS: ED Triage Vitals  Enc Vitals Group     BP 04/12/20  0836 113/81     Pulse Rate 04/12/20 0836 73     Resp 04/12/20 0836 15     Temp 04/12/20 0836 98.2 F (36.8 C)     Temp Source 04/12/20 0836 Oral     SpO2 04/12/20 0836 100 %     Weight 04/12/20 0833 295 lb (133.8 kg)     Height 04/12/20 0833 5\' 5"  (1.651 m)     Head Circumference --      Peak Flow --      Pain Score 04/12/20 0833 8     Pain Loc --      Pain Edu? --      Excl. in GC? --    Constitutional: Alert and oriented. Well appearing and in no acute distress. Cardiovascular: Normal rate, regular rhythm. Grossly normal heart sounds.  Good peripheral circulation. Respiratory: Normal respiratory effort.  No retractions. Lungs CTAB. Musculoskeletal: No obvious deformity left hand.  Patient has moderate guarding palpation third metacarpal carpal head on the palmar aspect.   Neurologic:  Normal speech and language. No gross focal neurologic deficits are appreciated. No gait instability. Skin:  Skin is warm, dry and intact. No rash noted. Psychiatric: Mood and affect are normal. Speech and behavior are normal.  ____________________________________________   LABS (all labs ordered are listed, but only abnormal results are displayed)  Labs Reviewed - No data to display ____________________________________________  EKG   ____________________________________________  RADIOLOGY  ED MD interpretation:    Official radiology report(s): DG Hand 2 View Left  Result Date: 04/12/2020 CLINICAL DATA:  Pt  reports left hand pain x 2 days, denies any injury. EXAM: LEFT HAND - 2 VIEW COMPARISON:  None. FINDINGS: There is no evidence of fracture or dislocation. There is no evidence of arthropathy or other focal bone abnormality. Soft tissues are unremarkable. IMPRESSION: Negative radiographs of the left hand. Electronically Signed   By: Audie Pinto M.D.   On: 04/12/2020 09:44    ____________________________________________   PROCEDURES  Procedure(s) performed (including Critical  Care):  Procedures   ____________________________________________   INITIAL IMPRESSION / ASSESSMENT AND PLAN / ED COURSE  As part of my medical decision making, I reviewed the following data within the [place]     Patient presents for left hand pain for 2 days.  Patient states no specific injury but there is a history repetitive motion at work.  Discussed x-ray findings with patient.  Patient complaint and physical exam consistent with repetitive motion injury.  Patient given discharge care instructions.  Patient advised to wear wrist support while work for the next 2 weeks.  Take medication as directed.  Follow orthopedic if no improvement in 2 weeks.    [name] was evaluated in Emergency Department on 04/12/2020 for the symptoms described in the history of present illness. She was evaluated in the context of the global COVID-19 pandemic, which necessitated consideration that the patient might be at risk for infection with the SARS-CoV-2 virus that causes COVID-19. Institutional protocols and algorithms that pertain to the evaluation of patients at risk for COVID-19 are in a state of rapid change based on information released by regulatory bodies including the CDC and federal and state organizations. These policies and algorithms were followed during the patient's care in the ED.       ____________________________________________   FINAL CLINICAL IMPRESSION(S) / ED DIAGNOSES  Final diagnoses:  Repetitive motion injury     ED Discharge Orders         Ordered    naproxen (NAPROSYN) 375 MG tablet  2 times daily with meals     Discontinue  Reprint     04/12/20 0940           Note:  This document was prepared using Dragon voice recognition software and may include unintentional dictation errors.    Sable Feil, PA-C 04/12/20 [id number]    Blake Divine, MD 04/13/20 [phone]

## 2020-04-12 NOTE — ED Triage Notes (Signed)
Pt reports pain to her left hand for the past 2 days. No injury that she is aware of.

## 2020-06-16 ENCOUNTER — Emergency Department: Payer: 59

## 2020-06-16 ENCOUNTER — Other Ambulatory Visit: Payer: Self-pay

## 2020-06-16 ENCOUNTER — Emergency Department
Admission: EM | Admit: 2020-06-16 | Discharge: 2020-06-17 | Disposition: A | Discharge status: Home / Self Care | Payer: 59 | Attending: Emergency Medicine | Admitting: Emergency Medicine

## 2020-06-16 DIAGNOSIS — R070 Pain in throat: Secondary | ICD-10-CM | POA: Diagnosis not present

## 2020-06-16 DIAGNOSIS — Z5321 Procedure and treatment not carried out due to patient leaving prior to being seen by health care provider: Secondary | ICD-10-CM | POA: Insufficient documentation

## 2020-06-16 DIAGNOSIS — R05 Cough: Secondary | ICD-10-CM | POA: Diagnosis not present

## 2020-06-16 DIAGNOSIS — R0602 Shortness of breath: Secondary | ICD-10-CM | POA: Diagnosis not present

## 2020-06-16 LAB — CBC
HCT: 37.8 % (ref 36.0–46.0)
Hemoglobin: 12.9 g/dL (ref 12.0–15.0)
MCH: 30.2 pg (ref 26.0–34.0)
MCHC: 34.1 g/dL (ref 30.0–36.0)
MCV: 88.5 fL (ref 80.0–100.0)
Platelets: 303 10*3/uL (ref 150–400)
RBC: 4.27 MIL/uL (ref 3.87–5.11)
RDW: 12.9 % (ref 11.5–15.5)
WBC: 7.1 10*3/uL (ref 4.0–10.5)
nRBC: 0 % (ref 0.0–0.2)

## 2020-06-16 LAB — COMPREHENSIVE METABOLIC PANEL
ALT: 26 U/L (ref 0–44)
AST: 31 U/L (ref 15–41)
Albumin: 4.4 g/dL (ref 3.5–5.0)
Alkaline Phosphatase: 40 U/L (ref 38–126)
Anion gap: 11 (ref 5–15)
BUN: 15 mg/dL (ref 6–20)
CO2: 24 mmol/L (ref 22–32)
Calcium: 9.1 mg/dL (ref 8.9–10.3)
Chloride: 102 mmol/L (ref 98–111)
Creatinine, Ser: 0.68 mg/dL (ref 0.44–1.00)
GFR calc Af Amer: >60 mL/min (ref >60)
GFR calc non Af Amer: >60 mL/min (ref >60)
Glucose, Bld: 73 mg/dL (ref 70–99)
Potassium: 3.3 mmol/L — ABNORMAL LOW (ref 3.5–5.1)
Sodium: 137 mmol/L (ref 135–145)
Total Bilirubin: 0.9 mg/dL (ref 0.3–1.2)
Total Protein: 7.9 g/dL (ref 6.5–8.1)

## 2020-06-16 NOTE — ED Notes (Signed)
Patient called, no answer.

## 2020-06-16 NOTE — ED Notes (Signed)
Pt called no answer. Pt not seen in lobby, bathroom, or outside.

## 2020-06-16 NOTE — ED Notes (Signed)
Pt called telephonically. No answer.

## 2020-06-16 NOTE — ED Triage Notes (Signed)
Pt arrives to ed via pov, ambulatory to triage with c/o sore throat, cough, and SOB with exertion x 2 days. NAD noted at this time

## 2021-02-19 ENCOUNTER — Other Ambulatory Visit: Payer: Self-pay

## 2021-02-19 ENCOUNTER — Emergency Department: Payer: BC Managed Care – PPO

## 2021-02-19 ENCOUNTER — Emergency Department
Admission: EM | Admit: 2021-02-19 | Discharge: 2021-02-19 | Disposition: A | Discharge status: Home / Self Care | Payer: BC Managed Care – PPO | Attending: Student in an Organized Health Care Education/Training Program | Admitting: Student in an Organized Health Care Education/Training Program

## 2021-02-19 DIAGNOSIS — O200 Threatened abortion: Secondary | ICD-10-CM | POA: Diagnosis not present

## 2021-02-19 DIAGNOSIS — O468X1 Other antepartum hemorrhage, first trimester: Secondary | ICD-10-CM

## 2021-02-19 DIAGNOSIS — Z2913 Encounter for prophylactic Rho(D) immune globulin: Secondary | ICD-10-CM | POA: Insufficient documentation

## 2021-02-19 DIAGNOSIS — O418X10 Other specified disorders of amniotic fluid and membranes, first trimester, not applicable or unspecified: Secondary | ICD-10-CM

## 2021-02-19 DIAGNOSIS — O209 Hemorrhage in early pregnancy, unspecified: Secondary | ICD-10-CM

## 2021-02-19 DIAGNOSIS — O4691 Antepartum hemorrhage, unspecified, first trimester: Secondary | ICD-10-CM | POA: Diagnosis present

## 2021-02-19 DIAGNOSIS — O4690 Antepartum hemorrhage, unspecified, unspecified trimester: Secondary | ICD-10-CM

## 2021-02-19 LAB — BASIC METABOLIC PANEL
Anion gap: 7 (ref 5–15)
BUN: 12 mg/dL (ref 6–20)
CO2: 23 mmol/L (ref 22–32)
Calcium: 9.2 mg/dL (ref 8.9–10.3)
Chloride: 106 mmol/L (ref 98–111)
Creatinine, Ser: 0.53 mg/dL (ref 0.44–1.00)
GFR, Estimated: >60 mL/min (ref >60)
Glucose, Bld: 95 mg/dL (ref 70–99)
Potassium: 3.7 mmol/L (ref 3.5–5.1)
Sodium: 136 mmol/L (ref 135–145)

## 2021-02-19 LAB — CBC
HCT: 38.3 % (ref 36.0–46.0)
Hemoglobin: 13.1 g/dL (ref 12.0–15.0)
MCH: 30 pg (ref 26.0–34.0)
MCHC: 34.2 g/dL (ref 30.0–36.0)
MCV: 87.8 fL (ref 80.0–100.0)
Platelets: 331 10*3/uL (ref 150–400)
RBC: 4.36 MIL/uL (ref 3.87–5.11)
RDW: 12.9 % (ref 11.5–15.5)
WBC: 9.6 10*3/uL (ref 4.0–10.5)
nRBC: 0 % (ref 0.0–0.2)

## 2021-02-19 LAB — RHOGAM INJECTION

## 2021-02-19 LAB — ABO/RH: ABO/RH(D): O NEG

## 2021-02-19 LAB — ANTIBODY SCREEN: Antibody Screen: NEGATIVE

## 2021-02-19 LAB — HCG, QUANTITATIVE, PREGNANCY: hCG, Beta Chain, Quant, S: 11578 m[IU]/mL — ABNORMAL HIGH (ref <5)

## 2021-02-19 MED ORDER — RHO D IMMUNE GLOBULIN 1500 UNIT/2ML IJ SOSY
300.0000 ug | PREFILLED_SYRINGE | Freq: Once | INTRAMUSCULAR | Status: AC
Start: 2021-02-19 — End: 2021-02-19
  Administered 2021-02-19: 300 ug via INTRAMUSCULAR
  Filled 2021-02-19: qty 2

## 2021-02-19 NOTE — ED Notes (Signed)
Reports 2 positive home pregnancy tests, states she thinks she is 5-[redacted] weeks pregnant. G2P0. States light spotted started on 4/24 and stopped but now has worsened. AO x4, talking in full sentences with regular and unlabored breathing

## 2021-02-19 NOTE — ED Provider Notes (Signed)
[facility] Emergency Department Provider Note  ____________________________________________   Event Date/Time   First MD Initiated Contact with Patient 02/19/21 1601     (approximate)  I have reviewed the triage vital signs and the nursing notes.   HISTORY  Chief Complaint Vaginal Bleeding   HPI [name] is a 25 y.o. female who reports to the emergency department for complaints of vaginal bleeding in pregnancy.  Patient states that last menstrual period was mid March and she took 2 positive home pregnancy test.  She believes that she is 5 to [redacted] weeks pregnant.  She reports that on Saturday, 4/23 she began to have light spotting that has become a little bit heavier, similar at times to her normal period.  She denies any abdominal cramping, pain, dysuria, lightheadedness, dizziness, fever or other associated symptoms.  She reports that this would be G2, P0.  First pregnancy resulted in elective abortion.        History reviewed. No pertinent past medical history.  There are no problems to display for this patient.   History reviewed. No pertinent surgical history.  Prior to Admission medications   Medication Sig Start Date End Date Taking? Authorizing Provider  naproxen (NAPROSYN) 375 MG tablet Take 1 tablet (375 mg total) by mouth 2 (two) times daily with a meal. 04/12/20   Joni Reining, PA-C    Allergies Patient has no known allergies.  History reviewed. No pertinent family history.  Social History Social History   Tobacco Use  . Smoking status: Never Smoker  . Smokeless tobacco: Never Used  Substance Use Topics  . Alcohol use: No  . Drug use: No    Review of Systems Constitutional: No fever/chills Eyes: No visual changes. ENT: No sore throat. Cardiovascular: Denies chest pain. Respiratory: Denies shortness of breath. Gastrointestinal: No abdominal pain.  No nausea, no vomiting.  No diarrhea.  No constipation. Genitourinary:  + Vaginal bleeding, + home pregnancy test, negative for dysuria. Musculoskeletal: Negative for back pain. Skin: Negative for rash. Neurological: Negative for headaches, focal weakness or numbness.   ____________________________________________   PHYSICAL EXAM:  VITAL SIGNS: ED Triage Vitals  Enc Vitals Group     BP 02/19/21 1519 139/84     Pulse Rate 02/19/21 1519 80     Resp 02/19/21 1519 18     Temp 02/19/21 1519 98.4 F (36.9 C)     Temp Source 02/19/21 1519 Oral     SpO2 02/19/21 1519 100 %     Weight 02/19/21 1518 295 lb (133.8 kg)     Height 02/19/21 1518 5\' 5"  (1.651 m)     Head Circumference --      Peak Flow --      Pain Score 02/19/21 1907 0     Pain Loc --      Pain Edu? --      Excl. in GC? --     Constitutional: Alert and oriented. Well appearing and in no acute distress. Eyes: Conjunctivae are normal. PERRL. EOMI. Head: Atraumatic. Nose: No congestion/rhinnorhea. Mouth/Throat: Mucous membranes are moist.  Oropharynx non-erythematous. Neck: No stridor.   Cardiovascular: Normal rate, regular rhythm. Grossly normal heart sounds.  Good peripheral circulation. Respiratory: Normal respiratory effort.  No retractions. Lungs CTAB. Gastrointestinal: Soft and nontender. No distention. No abdominal bruits. No CVA tenderness. Musculoskeletal: No lower extremity tenderness nor edema.  No joint effusions. Neurologic:  Normal speech and language. No gross focal neurologic deficits are appreciated. No gait instability. Skin:  Skin is warm, dry and intact. No rash noted. Psychiatric: Mood and affect are normal. Speech and behavior are normal.  ____________________________________________   LABS (all labs ordered are listed, but only abnormal results are displayed)  Labs Reviewed  HCG, QUANTITATIVE, PREGNANCY - Abnormal; Notable for the following components:      Result Value   hCG, Beta Chain, Quant, S 11,578 (*)    All other components within normal limits  CBC   BASIC METABOLIC PANEL  ABO/RH  ANTIBODY SCREEN  RHOGAM INJECTION   ____________________________________________  RADIOLOGY  Official radiology report(s): US OB LESS THAN 14 WEEKS WITH OB TRANSVAGINAL  Result Date: 02/19/2021 CLINICAL DATA:  30 year pregnant female with vaginal bleeding. LMP: 01/05/2021 corresponding to an estimated gestational age of [redacted] weeks, 3 days. EXAM: OBSTETRIC <14 WK [place] AND TRANSVAGINAL OB US TECHNIQUE: Both transabdominal and transvaginal ultrasound examinations were performed for complete evaluation of the gestation as well as the maternal uterus, adnexal regions, and pelvic cul-de-sac. Transvaginal technique was performed to assess early pregnancy. COMPARISON:  None. FINDINGS: Intrauterine gestational sac: Single intrauterine gestational sac. Yolk sac:  Seen Embryo: The greater is poorly visualized. Flickering activity in the gestational sac likely an early embryo. Cardiac Activity: Detected Heart Rate: 167 bpm CRL: 2 mm   5 w   5 d                  [place] EDC: 12/18/2020. Subchorionic hemorrhage: There is a large subchorionic hemorrhage measuring 2.5 x 3.3 x 1.5 cm inferior to the gestational sac and extending down in the endometrium. Maternal uterus/adnexae: Unremarkable. IMPRESSION: Single intrauterine pregnancy with probable early fetal pole measuring at 5 weeks, 5 days. There is a large subchorionic hemorrhage. Clinical correlation and follow-up with ultrasound in 7-11 days, or earlier if clinically indicated, recommended. Electronically Signed   By: Elgie Collard M.D.   On: 02/19/2021 18:04    ____________________________________________   INITIAL IMPRESSION / ASSESSMENT AND PLAN / ED COURSE  As part of my medical decision making, I reviewed the following data within the electronic MEDICAL RECORD NUMBER Nursing notes reviewed and incorporated, Labs reviewed, and Notes from prior ED visits        Patient is a 25 year old female who reports to the emergency  department for evaluation of vaginal bleeding in suspected pregnancy.  LMP was approximately 5 to 6 weeks ago.  Bleeding began on Saturday and has progressively become a little bit heavier, denies any associated abdominal pain, dysuria or any other related symptoms.  In triage, patient is hemodynamically stable.  On physical exam there is no tenderness to the abdomen or other gross finding.  Laboratory evaluation reveals a quantitative hCG of 11,500, her ABO Rh is O-.  Remainder of labs are grossly within normal limits.  Pelvic ultrasound was obtained and demonstrates a single IUP with probable early fetal pole measuring 5 weeks, 5 days.  There is a large subchorionic hemorrhage present.  Radiology recommends repeat imaging in 7 to 10 days.  Discussed the findings with the patient and the possible outcomes.  Discussed the importance of close OB follow-up.  She is already scheduled to follow-up with [facility] next Thursday, but I encouraged her to call tomorrow to determine if they would like earlier labs of any sort.  In addition, the patient is O- and will provide RhoGAM today.  She states that she received this after her elective abortion and is familiar.  Return precautions were discussed with her, and she  will otherwise follow-up with OB.  She stable this time for outpatient management.      ____________________________________________   FINAL CLINICAL IMPRESSION(S) / ED DIAGNOSES  Final diagnoses:  Vaginal bleeding in pregnancy  Subchorionic hemorrhage of placenta in first trimester, single or unspecified fetus  Threatened abortion     ED Discharge Orders    None      *Please note:  [name] was evaluated in Emergency Department on 02/19/2021 for the symptoms described in the history of present illness. She was evaluated in the context of the global COVID-19 pandemic, which necessitated consideration that the patient might be at risk for infection with the SARS-CoV-2 virus  that causes COVID-19. Institutional protocols and algorithms that pertain to the evaluation of patients at risk for COVID-19 are in a state of rapid change based on information released by regulatory bodies including the CDC and federal and state organizations. These policies and algorithms were followed during the patient's care in the ED.  Some ED evaluations and interventions may be delayed as a result of limited staffing during and the pandemic.*   Note:  This document was prepared using Dragon voice recognition software and may include unintentional dictation errors.   Lucy Chris, PA 02/19/21 1930    Willy Eddy, MD 02/19/21 [phone]

## 2021-02-19 NOTE — ED Notes (Signed)
ABO/RH not drawn.

## 2021-02-19 NOTE — ED Triage Notes (Signed)
Pt to ED via POV with c/o painless bright red vaginal bleeding. Pt states Saturday and has progressively continued and worsened. Pt denies any pain at this time. Pt is a G1.

## 2021-02-19 NOTE — Discharge Instructions (Signed)
Please start taking a Prenatal vitamin that includes folate. Please call [facility] tomorrow morning to let them know about your recent ER visit. Follow up next week at your appointment or earlier if they recommend. Return to ER if you develop any severe bleeding, lightheadedness, dizziness, weakness or other significant changes.

## 2021-02-20 LAB — RHOGAM INJECTION: Unit division: 0

## 2021-02-27 DIAGNOSIS — O468X1 Other antepartum hemorrhage, first trimester: Secondary | ICD-10-CM | POA: Diagnosis not present

## 2021-02-27 DIAGNOSIS — O418X10 Other specified disorders of amniotic fluid and membranes, first trimester, not applicable or unspecified: Secondary | ICD-10-CM | POA: Diagnosis not present

## 2021-03-03 DIAGNOSIS — O468X1 Other antepartum hemorrhage, first trimester: Secondary | ICD-10-CM | POA: Diagnosis not present

## 2021-03-03 DIAGNOSIS — O418X10 Other specified disorders of amniotic fluid and membranes, first trimester, not applicable or unspecified: Secondary | ICD-10-CM | POA: Diagnosis not present

## 2021-03-04 DIAGNOSIS — O418X10 Other specified disorders of amniotic fluid and membranes, first trimester, not applicable or unspecified: Secondary | ICD-10-CM | POA: Diagnosis not present

## 2021-03-04 DIAGNOSIS — O468X1 Other antepartum hemorrhage, first trimester: Secondary | ICD-10-CM | POA: Diagnosis not present

## 2021-03-04 DIAGNOSIS — O09893 Supervision of other high risk pregnancies, third trimester: Secondary | ICD-10-CM | POA: Insufficient documentation

## 2021-04-04 DIAGNOSIS — N898 Other specified noninflammatory disorders of vagina: Secondary | ICD-10-CM | POA: Diagnosis not present

## 2021-04-04 DIAGNOSIS — Z124 Encounter for screening for malignant neoplasm of cervix: Secondary | ICD-10-CM | POA: Diagnosis not present

## 2021-04-04 DIAGNOSIS — O468X1 Other antepartum hemorrhage, first trimester: Secondary | ICD-10-CM | POA: Diagnosis not present

## 2021-04-04 DIAGNOSIS — O09891 Supervision of other high risk pregnancies, first trimester: Secondary | ICD-10-CM | POA: Diagnosis not present

## 2021-04-04 DIAGNOSIS — O418X10 Other specified disorders of amniotic fluid and membranes, first trimester, not applicable or unspecified: Secondary | ICD-10-CM | POA: Diagnosis not present

## 2021-04-04 DIAGNOSIS — Z114 Encounter for screening for human immunodeficiency virus [HIV]: Secondary | ICD-10-CM | POA: Diagnosis not present

## 2021-04-04 LAB — OB RESULTS CONSOLE RUBELLA ANTIBODY, IGM: Rubella: NON-IMMUNE/NOT IMMUNE

## 2021-04-04 LAB — OB RESULTS CONSOLE HEPATITIS B SURFACE ANTIGEN: Hepatitis B Surface Ag: NEGATIVE

## 2021-04-04 LAB — OB RESULTS CONSOLE VARICELLA ZOSTER ANTIBODY, IGG: Varicella: NON-IMMUNE/NOT IMMUNE

## 2021-04-04 LAB — OB RESULTS CONSOLE GC/CHLAMYDIA
Chlamydia: NEGATIVE
Gonorrhea: NEGATIVE

## 2021-05-02 DIAGNOSIS — R946 Abnormal results of thyroid function studies: Secondary | ICD-10-CM | POA: Diagnosis not present

## 2021-05-06 ENCOUNTER — Inpatient Hospital Stay: Admission: RE | Admit: 2021-05-06 | Payer: BC Managed Care – PPO | Source: Ambulatory Visit

## 2021-05-30 DIAGNOSIS — O09892 Supervision of other high risk pregnancies, second trimester: Secondary | ICD-10-CM | POA: Diagnosis not present

## 2021-05-30 DIAGNOSIS — R7989 Other specified abnormal findings of blood chemistry: Secondary | ICD-10-CM | POA: Diagnosis not present

## 2021-06-06 IMAGING — DX DG HAND 2V*L*
2 series · 2 of 2 positions shown · non-contrast
Comparison: None.

CLINICAL DATA: Pt reports left hand pain x 2 days, denies any
injury.

EXAM:
LEFT HAND - 2 VIEW

[hand ap]
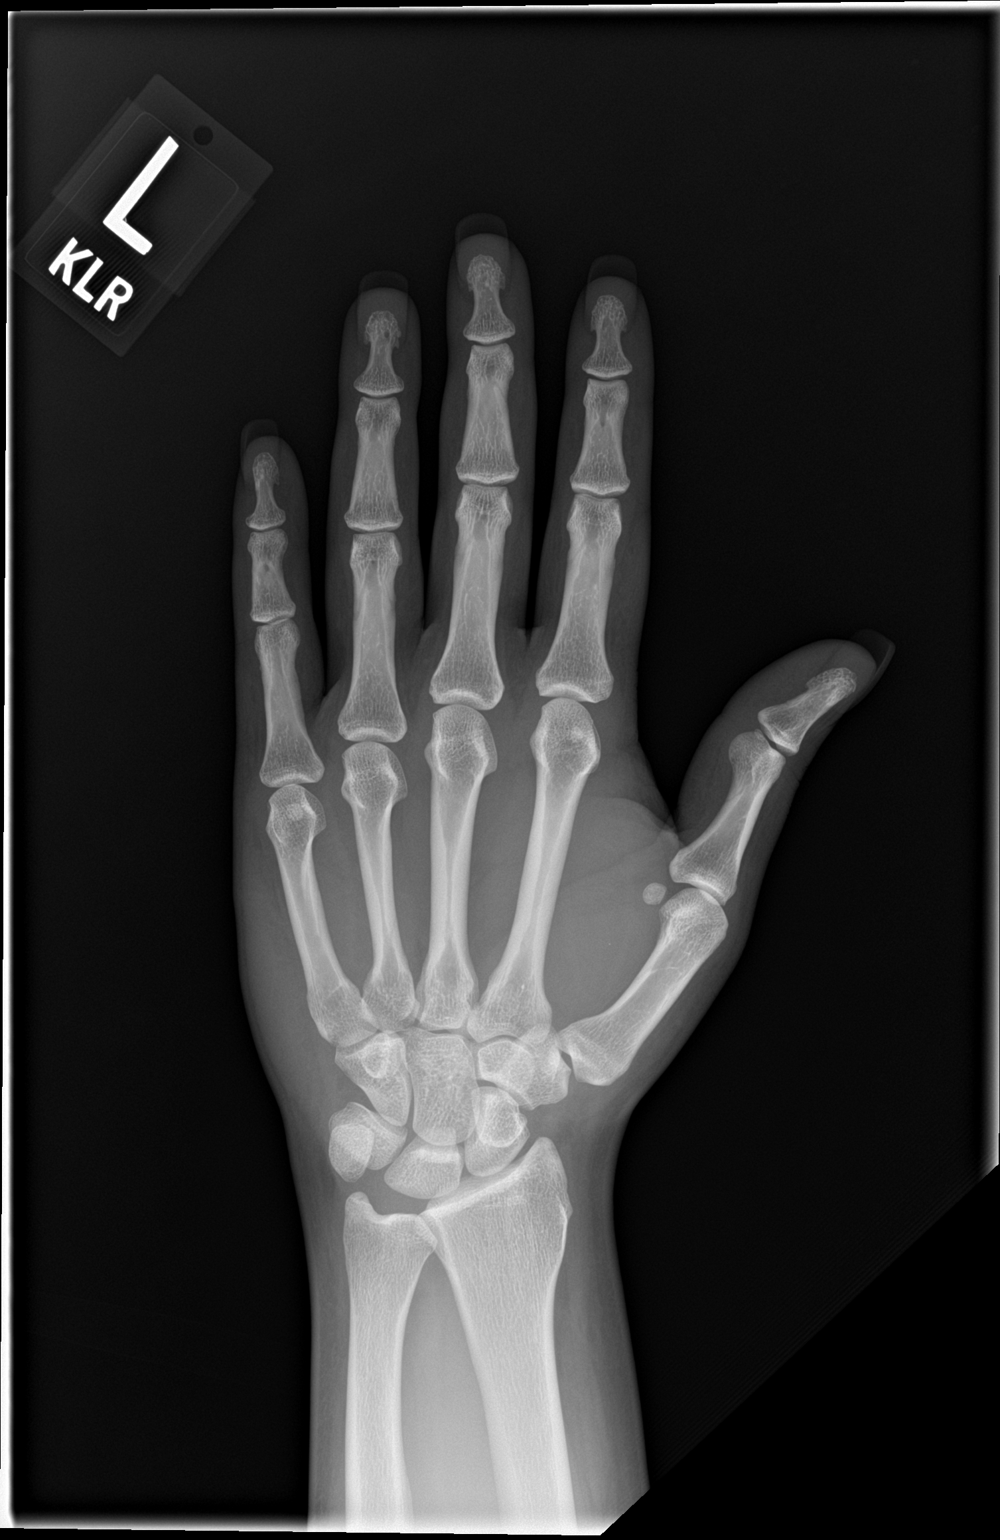

[hand lat]
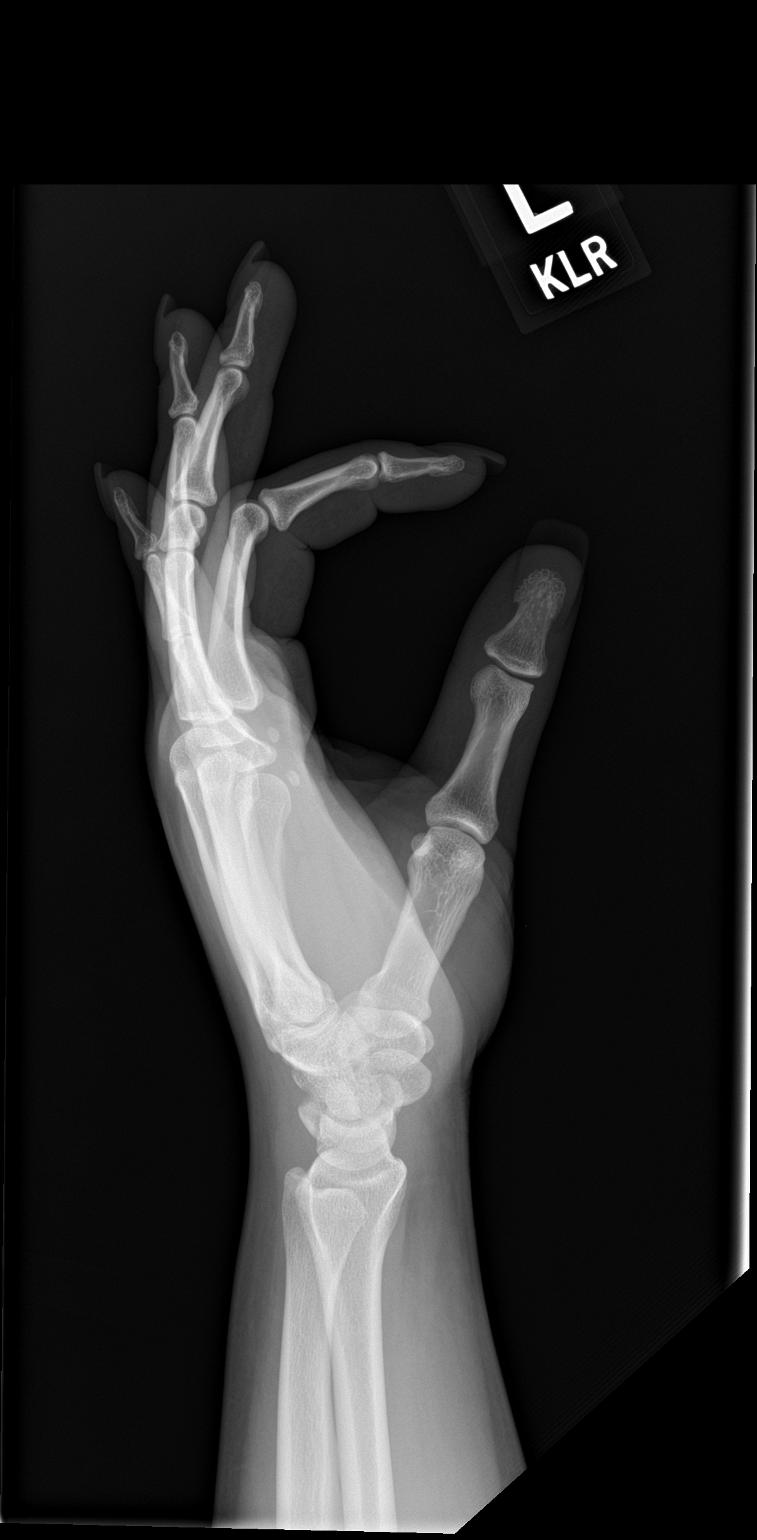

[2 of 2 positions shown; findings below may reference images not displayed]

FINDINGS: There is no evidence of fracture or dislocation. There is no
evidence of arthropathy or other focal bone abnormality. Soft
tissues are unremarkable.
IMPRESSION: Negative radiographs of the left hand.

## 2021-07-25 DIAGNOSIS — O360130 Maternal care for anti-D [Rh] antibodies, third trimester, not applicable or unspecified: Secondary | ICD-10-CM | POA: Diagnosis not present

## 2021-07-25 DIAGNOSIS — O09892 Supervision of other high risk pregnancies, second trimester: Secondary | ICD-10-CM | POA: Diagnosis not present

## 2021-07-25 DIAGNOSIS — Z23 Encounter for immunization: Secondary | ICD-10-CM | POA: Diagnosis not present

## 2021-07-25 DIAGNOSIS — O26843 Uterine size-date discrepancy, third trimester: Secondary | ICD-10-CM | POA: Diagnosis not present

## 2021-07-28 DIAGNOSIS — R7989 Other specified abnormal findings of blood chemistry: Secondary | ICD-10-CM | POA: Diagnosis not present

## 2021-08-08 DIAGNOSIS — Z23 Encounter for immunization: Secondary | ICD-10-CM | POA: Diagnosis not present

## 2021-08-14 ENCOUNTER — Other Ambulatory Visit: Payer: Self-pay

## 2021-08-14 ENCOUNTER — Encounter
Admission: RE | Admit: 2021-08-14 | Discharge: 2021-08-14 | Disposition: A | Discharge status: Home / Self Care | Payer: BC Managed Care – PPO | Source: Ambulatory Visit | Attending: Anesthesiology | Admitting: Anesthesiology

## 2021-08-14 NOTE — Consult Note (Signed)
[facility] Anesthesia Consultation  [place] Bertini SWN:[id number] DOB: 28-Feb-1996 DOA: 08/14/2021 PCP: Oneita Hurt, No   Requesting physician: Dr. Feliberto Gottron Date of consultation: 08/14/21 Reason for consultation: Obesity during pregnancy  CHIEF COMPLAINT:  Obesity during pregnancy  HISTORY OF PRESENT ILLNESS: [name]  is a 25 y.o. female with a known history of obesity during pregnancy. Denies hx of cardiovascular disease. Denies hx of asthma. Denies personal or family hx of bleeding disorders.  PAST MEDICAL HISTORY:  No past medical history on file.  PAST SURGICAL HISTORY: No past surgical history on file.  SOCIAL HISTORY:  Social History   Tobacco Use   Smoking status: Never   Smokeless tobacco: Never  Substance Use Topics   Alcohol use: No    FAMILY HISTORY: No family history on file.  DRUG ALLERGIES: No Known Allergies  REVIEW OF SYSTEMS:   RESPIRATORY: No cough, shortness of breath, wheezing.  CARDIOVASCULAR: No chest pain, orthopnea, edema.  HEMATOLOGY: No anemia, easy bruising or bleeding SKIN: No rash or lesion. NEUROLOGIC: No tingling, numbness, weakness.  PSYCHIATRY: No anxiety or depression.   MEDICATIONS AT HOME:  Prior to Admission medications   Medication Sig Start Date End Date Taking? Authorizing Provider  naproxen (NAPROSYN) 375 MG tablet Take 1 tablet (375 mg total) by mouth 2 (two) times daily with a meal. 04/12/20   Joni Reining, PA-C      PHYSICAL EXAMINATION:   VITAL SIGNS: Last menstrual period 01/05/2021.  GENERAL:  25 y.o.-year-old patient no acute distress.  HEENT: Head atraumatic, normocephalic. Oropharynx and nasopharynx clear. MP 2, TM distance >3 cm, normal mouth opening, grade 1 upper lip bite LUNGS: No use of accessory muscles of respiration.   EXTREMITIES: No pedal edema, cyanosis, or clubbing.  NEUROLOGIC: normal gait PSYCHIATRIC: The patient is alert and oriented x 3.  SKIN: No  obvious rash, lesion, or ulcer.    IMPRESSION AND PLAN:   [name]  is a 25 y.o. female presenting with obesity during pregnancy. BMI is currently 53 at [redacted] weeks gestation.   Airway exam reassuring. Spinal interspaces minimally palpable.   We discussed analgesic options during labor including epidural analgesia. Discussed that in obesity there can be increased difficulty with epidural placement or even failure of successful epidural. We also discussed that even after successful epidural placement there is increased risk of catheter migration out of the epidural space that would require catheter replacement. Discussed use of epidural vs spinal vs GA if cesarean delivery is required. Discussed increased risk of difficult intubation during pregnancy should an emergency cesarean delivery be required.   Plan for delivery at [facility].

## 2021-08-19 ENCOUNTER — Encounter: Payer: Self-pay | Admitting: Physical Therapy

## 2021-08-19 ENCOUNTER — Ambulatory Visit: Payer: BC Managed Care – PPO | Attending: Certified Nurse Midwife | Admitting: Physical Therapy

## 2021-08-19 ENCOUNTER — Other Ambulatory Visit: Payer: Self-pay

## 2021-08-19 DIAGNOSIS — M25559 Pain in unspecified hip: Secondary | ICD-10-CM | POA: Diagnosis not present

## 2021-08-19 DIAGNOSIS — R102 Pelvic and perineal pain unspecified side: Secondary | ICD-10-CM

## 2021-08-19 DIAGNOSIS — M533 Sacrococcygeal disorders, not elsewhere classified: Secondary | ICD-10-CM | POA: Diagnosis not present

## 2021-08-19 DIAGNOSIS — O99891 Other specified diseases and conditions complicating pregnancy: Secondary | ICD-10-CM | POA: Diagnosis present

## 2021-08-19 DIAGNOSIS — O26893 Other specified pregnancy related conditions, third trimester: Secondary | ICD-10-CM | POA: Insufficient documentation

## 2021-08-19 DIAGNOSIS — M5417 Radiculopathy, lumbosacral region: Secondary | ICD-10-CM

## 2021-08-19 NOTE — Therapy (Signed)
[facility] [address] [place], [place], [address] Phone: [phone]   Fax:  [phone]  Physical Therapy Evaluation  Patient Details  Name: Denina Rieger MRN: [id number] Date of Birth: 1996/08/20 Referring Provider (PT): Haroldine Laws  Encounter Date: 08/19/2021   PT End of Session - 08/19/21 0941     Visit Number 1    Number of Visits 12    Date for PT Re-Evaluation 09/30/21    Authorization Type IE: 08/19/2021    PT Start Time 0945    PT Stop Time 1025    PT Time Calculation (min) 40 min    Activity Tolerance Patient tolerated treatment well    Behavior During Therapy [facility] for tasks assessed/performed             History reviewed. No pertinent past medical history.  History reviewed. No pertinent surgical history.  There were no vitals filed for this visit.  PHYSICAL THERAPY EVALUATION   SCREENING Red Flags: none  Have you had any night sweats? Unexplained weight loss? Saddle anesthesia? Unexplained changes in bowel or bladder habits?  Mental Status Patient is oriented to person, place and time.  Recent memory is intact.  Remote memory is intact.  Attention span and concentration are intact.  Expressive speech is intact.  Patient's fund of knowledge is within normal limits for educational level.  POSTURE/OBSERVATIONS:  Lumbar lordosis: increased  Thoracic kyphosis: WNL Iliac crest height: equal bilaterally Pelvic obliquity: L posteriorly rotated   GAIT: Wide-based gait due to [redacted] weeks pregnant but observed to be WNL.    Chief Complaint: About a month ago (~26 weeks), patient finished taking a shower and went to dry off the right leg then the left leg and felt a pop at the L hip joint and it has not been the same ever since. The patient was able to walk after that with pain (8-9/10) and it felt like a "pinched nerve." The patient has numbness down to B metatarsals and notices it after a long period  of time sitting (~10 mins). The pain in B hips also begins after sitting for more than 10 mins. Patient feels pressure at the vaginal opening as well as the pubic symphysis. Patient feels pressure at the pubic symphysis with getting in/out of the car, STS transfers, and bed transfers. Patient cannot lie on her back as she experiences increased labored breathing and it is not comfortable. She wakes up every hour due to the pain and has to reposition because she feels B hips go numb. Downward dog helps relieve the numbness/pain for about an hour. Walking or any other movement does not seem to help the pain or numbness at all.   Location of pain: B hips  Current pain:  0/10, patient had to stand after 10 mins because pain started Max pain:  9/10 Least pain:  1/10 Pain quality: pain quality: numbness and tingling Radiating pain: Yes   Location of pain: suprapubic  Current pain:  0/10, patient had to stand after 10 mins because pain started Max pain:  9/10 Least pain:  1/10 Pain quality: pain quality: pressure  Radiating pain: Yes   Pertinent History:  Falls: negative  Pulmonary disease/dysfunction: negative   Obstetrical History: G1P0  Due date: 10/18/2021   RANGE OF MOTION:    LEFT RIGHT  Lumbar forward flexion (65):  Right below knees*    Lumbar extension (30): WNL*    Lumbar lateral flexion (25):  WNL* WNL*  Thoracic and Lumbar rotation (30):    WNL WNL  *pain provoking in hips   SENSATION: Deferred 2/2 to time constraints and increased pain  Grossly intact to light touch bilateral LEs as determined by testing dermatomes L2-S2 Proprioception and hot/cold testing deferred on this date  STRENGTH: MMT Deferred 2/2 to time constraints and increased pain   RLE LLE  Hip Flexion    Hip Extension    Hip Abduction     Hip Adduction     Hip ER     Hip IR     Knee Extension    Knee Flexion    Dorsiflexion     Plantarflexion (seated)      SPECIAL TESTS: Slump (SN 83, -LR 0.32):  R: positive L: positive   Deferred 2/2 to time constraints and increased pain  SLR (SN 92, -LR 0.29): R: negative positive L: negative positive Stork/March (SP 93): R: negative positive L: negative positive  TTPs: Deferred 2/2 to time constraints and increased pain   Current activities:  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 Today's evaluation suggest deficits in pain, posture, ROM, nerve irritation, PFM coordination, PFM strength, and mobility as evidenced by increased radicular symptoms stopping before B metatarsals, 9/10 worst hip pain, pain with spinal ROM, pressure at pubic symphysis (9/10), and inability to sit for more than 10 mins. Upon examination, patient demonstrates B +Slump test, a L posterior obliquity of the innominate, and radicular symptoms centralized with repeated thoracic extension. Patient's responses on FOTO-Hip outcome measures (26) indicate significant functional limitations/disability/distress. Patient's progress may be limited due to progression of pregnancy; however, patient's motivation is advantageous. Patient was able to achieve basic understanding of pregnancy related changes, pelvic anatomy, and bed mobility transfers during today's evaluation and responded positively to eductional interventions. Patient will benefit from continued skilled therapeutic intervention to address deficits in pain, posture, ROM, nerve irritation, PFM coordination, PFM strength, and mobility in order to increase function, and improve overall QOL.  EDUCATION Patient educated on what to expect during course of physical therapy, POC, and provided with HEP including: seated adduction isometric, standing thoracic  extension, sciatic nerve glides, and pillow use when sidelying in bed. Patient verbalized understanding and returned demonstration. Patient will benefit from further education in order to maximize compliance and understanding to achieve established long-term goals.      PT Long Term Goals - 08/19/21 1144       PT LONG TERM GOAL #1   Title Patient will demonstrate >/= 35 point change (61) in the FOTO outcome measure in order to represent a clinically significant change that ultimately will reflect an improved QOL.    Baseline IE: 26    Time 6    Period Weeks    Status New    Target Date 09/30/21      PT LONG TERM GOAL #2   Title Patient will decrease worst pain as reported on NPRS by at least 2 points to demonstrate clinically significant reduction in pain in order to restore/improve function and overall QOL.    Baseline IE: 9/10    Time 6    Period Weeks    Status New    Target Date 09/30/21  PT LONG TERM GOAL #3   Title Patient will be able to rest comfortably in preferred position (sitting/supine) for more than 10 minutes with limited pain or radicular symptoms in B LEs in order to improve sleep hygiene and allow for minimal disruption in ADLs.    Baseline IE: 10 mins or less    Time 6    Period Weeks    Status New    Target Date 09/30/21      PT LONG TERM GOAL #4   Title Patient will report being able to return to activities including, but not limited to: grocery shopping or light activities in the home with a little bit of limitation to indicate progression towards resolution of the chief complaint and return to prior level of participation at home and in the community.    Baseline IE: extreme difficulty (FOTO)    Time 6    Period Weeks    Status New    Target Date 09/30/21               Plan - 08/19/21 1100     Clinical Impression Statement Patient is a 25 year old presenting to clinic with chief complaints of B hip pain with radicular symptoms during  pregnacy. Today's evaluation suggest deficits in pain, posture, ROM, nerve irritation, PFM coordination, PFM strength, and mobility as evidenced by increased radicular symptoms stopping before B metatarsals, 9/10 worst hip pain, pain with spinal ROM, pressure at pubic symphysis (9/10), and inability to sit for more than 10 mins. Upon examination, patient demonstrates B +Slump test, a L posterior obliquity of the innominate, and radicular symptoms centralized with repeated lumbothoracic extension. Patient's responses on FOTO-Hip outcome measures (26) indicate significant functional limitations/disability/distress. Patient's progress may be limited due to progression of pregnancy; however, patient's motivation is advantageous. Patient was able to achieve basic understanding of pregnancy related changes, pelvic anatomy, and bed mobility transfers during today's evaluation and responded positively to eductional interventions. Patient will benefit from continued skilled therapeutic intervention to address deficits in pain, posture, ROM, nerve irritation, PFM coordination, PFM strength, and mobility in order to increase function, and improve overall QOL.    Personal Factors and Comorbidities Comorbidity 1;Time since onset of injury/illness/exacerbation    Comorbidities subchorionic hemorrhage of placenta in 1st trimester    Examination-Activity Limitations Sit;Transfers;Bed Mobility;Sleep;Bathing;Bend;Lift;Squat;Locomotion Level;Stand;Carry    Examination-Participation Restrictions Interpersonal Relationship;Occupation;Cleaning;Laundry;Driving;Shop;Community Activity    Stability/Clinical Decision Making Evolving/Moderate complexity    Clinical Decision Making Moderate    Rehab Potential Good    PT Frequency 2x / week    PT Duration 6 weeks    PT Treatment/Interventions ADLs/Self Care Home Management;Cryotherapy;Moist Heat;Electrical Stimulation;Therapeutic activities;Functional mobility training;Gait  training;Stair training;Therapeutic exercise;Balance training;Neuromuscular re-education;Patient/family education;Orthotic Fit/Training;Scar mobilization;Manual techniques;Compression bandaging;Passive range of motion;Energy conservation;Taping    PT Next Visit Plan finish physical assessment, pain modulation    PT Home Exercise Plan [id number]    Consulted and Agree with Plan of Care Patient               [facility] PT Assessment - 08/19/21 0944       Assessment   Medical Diagnosis LBP and B hip pain    Referring Provider (PT) Haroldine Laws    Hand Dominance Right      Balance Screen   Has the patient fallen in the past 6 months No             Objective measurements completed on examination: See above findings.    Patient will benefit  from skilled therapeutic intervention in order to improve the following deficits and impairments:  Abnormal gait, Difficulty walking, Increased muscle spasms, Improper body mechanics, Decreased activity tolerance, Decreased coordination, Decreased strength, Hypermobility, Postural dysfunction, Pain  Visit Diagnosis: Radiculopathy, lumbosacral region  Pregnancy related hip pain, antepartum, third trimester  Pregnancy related symphysis pain, antepartum, third trimester  Sacroiliac pain during pregnancy     Problem List There are no problems to display for this patient.   Caymen Dubray, SPT  This entire session was performed under direct supervision and direction of a licensed Estate agent. I have personally read, edited and approve of the note as written.   Sheria Lang PT, DPT [phone]  08/19/2021, 2:37 PM  [place] [facility] [address] [place], [place], [address] Phone: [phone]   Fax:  [phone]  Name: Danel Studzinski MRN: [id number] Date of Birth: 1996/06/23

## 2021-08-22 ENCOUNTER — Encounter: Payer: BC Managed Care – PPO | Admitting: Physical Therapy

## 2021-08-25 DIAGNOSIS — O26843 Uterine size-date discrepancy, third trimester: Secondary | ICD-10-CM | POA: Diagnosis not present

## 2021-08-26 ENCOUNTER — Other Ambulatory Visit: Payer: Self-pay

## 2021-08-26 ENCOUNTER — Ambulatory Visit: Payer: BC Managed Care – PPO | Attending: Certified Nurse Midwife | Admitting: Physical Therapy

## 2021-08-26 ENCOUNTER — Encounter: Payer: Self-pay | Admitting: Physical Therapy

## 2021-08-26 DIAGNOSIS — R102 Pelvic and perineal pain unspecified side: Secondary | ICD-10-CM

## 2021-08-26 DIAGNOSIS — M25559 Pain in unspecified hip: Secondary | ICD-10-CM | POA: Diagnosis not present

## 2021-08-26 DIAGNOSIS — M5417 Radiculopathy, lumbosacral region: Secondary | ICD-10-CM

## 2021-08-26 DIAGNOSIS — O99891 Other specified diseases and conditions complicating pregnancy: Secondary | ICD-10-CM

## 2021-08-26 DIAGNOSIS — M533 Sacrococcygeal disorders, not elsewhere classified: Secondary | ICD-10-CM | POA: Insufficient documentation

## 2021-08-26 DIAGNOSIS — O26893 Other specified pregnancy related conditions, third trimester: Secondary | ICD-10-CM | POA: Diagnosis not present

## 2021-08-26 NOTE — Therapy (Signed)
[place] [facility] [address] [place], [place], [address] Phone: [phone]   Fax:  [phone]  Physical Therapy Treatment  Patient Details  Name: [name] MRN: [id number] Date of Birth: 29-Jan-1996 Referring Provider (PT): Haroldine Laws   Encounter Date: 08/26/2021   PT End of Session - 08/26/21 0946     Visit Number 2    Number of Visits 12    Date for PT Re-Evaluation 09/30/21    Authorization Type IE: 08/19/2021    PT Start Time 0945    PT Stop Time 1025    PT Time Calculation (min) 40 min    Activity Tolerance Patient tolerated treatment well    Behavior During Therapy [facility] for tasks assessed/performed             History reviewed. No pertinent past medical history.  History reviewed. No pertinent surgical history.  There were no vitals filed for this visit.   Subjective Assessment - 08/26/21 0947     Subjective Patient reports back and hip pain has resolved mostly with application of HEP stretches. Patient states that she has most of the pain is isolated to pubic symphysis at this point. Patient has pain at rest and with large amplitude movements. Patient has not figured out anything that resolves the pain. Patient notes pain increases over the course of the day and is worst at bed time.    Currently in Pain? Yes    Pain Score 8     Pain Location Pelvis    Pain Orientation Medial            TREATMENT  Neuromuscular Re-education: Seated hip adduction isometrics with ball for improved stabilization of PSJ Quadruped cat cow for improved lumbopelvic mobility with VCs and TCs to decrease compensatory mechanisms Quadruped TrA activation with coordinated breath for improved postural control and PSJ stabilization Seated slouch-over correct for improved lumbopelvic mobility with VCs and TCs to decrease compensatory mechanisms Seated TrA activation with coordinated breath for improved postural control and PSJ  stabilization   Patient educated throughout session on appropriate technique and form using multi-modal cueing, HEP, and activity modification. Patient articulated understanding and returned demonstration.  Patient Response to interventions: 4/10 pain  ASSESSMENT Patient presents to clinic with excellent motivation to participate in therapy. Patient demonstrates deficits in pain, posture, ROM, nerve irritation, PFM coordination, PFM strength, and mobility. Patient able to achieve significant reduction in pain (8/10 session start to 4/10 session end) during today's session and responded positively to active postural interventions. Patient will benefit from continued skilled therapeutic intervention to address remaining deficits in pain, posture, ROM, nerve irritation, PFM coordination, PFM strength, and mobility in order to increase function and improve overall QOL.     PT Long Term Goals - 08/19/21 1144       PT LONG TERM GOAL #1   Title Patient will demonstrate >/= 35 point change (61) in the FOTO outcome measure in order to represent a clinically significant change that ultimately will reflect an improved QOL.    Baseline IE: 26    Time 6    Period Weeks    Status New    Target Date 09/30/21      PT LONG TERM GOAL #2   Title Patient will decrease worst pain as reported on NPRS by at least 2 points to demonstrate clinically significant reduction in pain in order to restore/improve function and overall QOL.    Baseline IE: 9/10  Time 6    Period Weeks    Status New    Target Date 09/30/21      PT LONG TERM GOAL #3   Title Patient will be able to rest comfortably in preferred position (sitting/supine) for more than 10 minutes with limited pain or radicular symptoms in B LEs in order to improve sleep hygiene and allow for minimal disruption in ADLs.    Baseline IE: 10 mins or less    Time 6    Period Weeks    Status New    Target Date 09/30/21      PT LONG TERM GOAL #4    Title Patient will report being able to return to activities including, but not limited to: grocery shopping or light activities in the home with a little bit of limitation to indicate progression towards resolution of the chief complaint and return to prior level of participation at home and in the community.    Baseline IE: extreme difficulty (FOTO)    Time 6    Period Weeks    Status New    Target Date 09/30/21                   Plan - 08/26/21 0947     Clinical Impression Statement Patient presents to clinic with excellent motivation to participate in therapy. Patient demonstrates deficits in pain, posture, ROM, nerve irritation, PFM coordination, PFM strength, and mobility. Patient able to achieve significant reduction in pain (8/10 session start to 4/10 session end) during today's session and responded positively to active postural interventions. Patient will benefit from continued skilled therapeutic intervention to address remaining deficits in pain, posture, ROM, nerve irritation, PFM coordination, PFM strength, and mobility in order to increase function and improve overall QOL.    Personal Factors and Comorbidities Comorbidity 1;Time since onset of injury/illness/exacerbation    Comorbidities subchorionic hemorrhage of placenta in 1st trimester    Examination-Activity Limitations Sit;Transfers;Bed Mobility;Sleep;Bathing;Bend;Lift;Squat;Locomotion Level;Stand;Carry    Examination-Participation Restrictions Interpersonal Relationship;Occupation;Cleaning;Laundry;Driving;Shop;Community Activity    Stability/Clinical Decision Making Evolving/Moderate complexity    Rehab Potential Good    PT Frequency 2x / week    PT Duration 6 weeks    PT Treatment/Interventions ADLs/Self Care Home Management;Cryotherapy;Moist Heat;Electrical Stimulation;Therapeutic activities;Functional mobility training;Gait training;Stair training;Therapeutic exercise;Balance training;Neuromuscular  re-education;Patient/family education;Orthotic Fit/Training;Scar mobilization;Manual techniques;Compression bandaging;Passive range of motion;Energy conservation;Taping    PT Next Visit Plan positions to prepare for birth, birthing body mechanics    PT Home Exercise Plan [id number]    Consulted and Agree with Plan of Care Patient             Patient will benefit from skilled therapeutic intervention in order to improve the following deficits and impairments:  Abnormal gait, Difficulty walking, Increased muscle spasms, Improper body mechanics, Decreased activity tolerance, Decreased coordination, Decreased strength, Hypermobility, Postural dysfunction, Pain  Visit Diagnosis: Radiculopathy, lumbosacral region  Pregnancy related hip pain, antepartum, third trimester  Pregnancy related symphysis pain, antepartum, third trimester  Sacroiliac pain during pregnancy     Problem List There are no problems to display for this patient.   Sheria Lang PT, [place] #[id number]  08/26/2021, 11:37 AM  [facility] [address] [place], [place], [address] Phone: [phone]   Fax:  [phone]  Name: [name] MRN: [id number] Date of Birth: 17-Jan-1996

## 2021-08-27 ENCOUNTER — Observation Stay
Admission: EM | Admit: 2021-08-27 | Discharge: 2021-08-28 | Disposition: A | Discharge status: Home / Self Care | Payer: BC Managed Care – PPO | Attending: Obstetrics and Gynecology | Admitting: Obstetrics and Gynecology

## 2021-08-27 ENCOUNTER — Inpatient Hospital Stay: Payer: BC Managed Care – PPO

## 2021-08-27 ENCOUNTER — Other Ambulatory Visit: Payer: Self-pay

## 2021-08-27 DIAGNOSIS — O4693 Antepartum hemorrhage, unspecified, third trimester: Secondary | ICD-10-CM | POA: Diagnosis not present

## 2021-08-27 DIAGNOSIS — Z6791 Unspecified blood type, Rh negative: Secondary | ICD-10-CM | POA: Diagnosis not present

## 2021-08-27 DIAGNOSIS — Z3A33 33 weeks gestation of pregnancy: Secondary | ICD-10-CM | POA: Diagnosis not present

## 2021-08-27 DIAGNOSIS — O468X3 Other antepartum hemorrhage, third trimester: Secondary | ICD-10-CM | POA: Diagnosis not present

## 2021-08-27 LAB — CBC
HCT: 33.8 % — ABNORMAL LOW (ref 36.0–46.0)
Hemoglobin: 11.6 g/dL — ABNORMAL LOW (ref 12.0–15.0)
MCH: 29.8 pg (ref 26.0–34.0)
MCHC: 34.3 g/dL (ref 30.0–36.0)
MCV: 86.9 fL (ref 80.0–100.0)
Platelets: 277 10*3/uL (ref 150–400)
RBC: 3.89 MIL/uL (ref 3.87–5.11)
RDW: 13.3 % (ref 11.5–15.5)
WBC: 10.2 10*3/uL (ref 4.0–10.5)
nRBC: 0 % (ref 0.0–0.2)

## 2021-08-27 MED ORDER — ACETAMINOPHEN 325 MG PO TABS
650.0000 mg | ORAL_TABLET | ORAL | Status: DC | PRN
  Administered 2021-08-27: 650 mg via ORAL
  Filled 2021-08-27: qty 2

## 2021-08-27 NOTE — OB Triage Note (Signed)
Pt presents around 4pm she went to the bathroom and had bright red bleeding. Pt states it was a moderate amount. Pt states she was eating chick fila and noticed the bleeding when she went to the bathroom. Pt denies any pain. Pt denies LOF. Pt reports positive fetal movement. PT denies any urinary symptoms. Last intercourse was Sunday. VSS. Will continue to monitor.

## 2021-08-28 ENCOUNTER — Encounter: Payer: BC Managed Care – PPO | Admitting: Physical Therapy

## 2021-08-28 DIAGNOSIS — Z3A33 33 weeks gestation of pregnancy: Secondary | ICD-10-CM | POA: Diagnosis not present

## 2021-08-28 DIAGNOSIS — O4693 Antepartum hemorrhage, unspecified, third trimester: Secondary | ICD-10-CM | POA: Diagnosis not present

## 2021-08-28 DIAGNOSIS — Z6791 Unspecified blood type, Rh negative: Secondary | ICD-10-CM | POA: Diagnosis not present

## 2021-08-28 DIAGNOSIS — O468X3 Other antepartum hemorrhage, third trimester: Secondary | ICD-10-CM | POA: Diagnosis not present

## 2021-08-28 NOTE — Progress Notes (Signed)
Pt discharged home per J.Oxley,CNM order.  No new episodes of vaginal bleeding overnight or this morning, pt denies ctx, states positive FM, and notes no pain. Pt stable and ambulatory and an After Visit Summary was printed and given to the patient. Discharge education completed with patient/family including follow up instructions, appointments, and medication list. Pt received labor and bleeding precautions. Patient able to verbalize understanding, all questions fully answered upon discharge. Patient instructed to return to ED, call 911, or call MD for any changes in condition. Pt discharged home via personal vehicle with support person.

## 2021-08-28 NOTE — Discharge Summary (Signed)
Patient ID: [name] MRN: [id number] DOB/AGE: 19-Feb-1996 24 y.o.  Admit date: 08/27/2021 Discharge date: 08/28/2021  Admission Diagnoses: 24yo G2P0 at [redacted]w[redacted]d presents with a moderate amount of bright red bleeding around 4pm. Pt reports IC on Sunday. H/o significant for a [facility] in early pregnancy that was confirmed resolved.  Discharge Diagnoses: Observed through the night with no further bleeding  Factors complicating pregnancy: 1. Subchorionic Hemorrhage- RESOLVED 04/04/21 2. RH Negative  3. Abnormal TSH 2.87 4. Varicella and Rubella Non-Immune 5. Obesity BMI: 50.62  Prenatal Procedures: NST, ultrasound, and labs  Consults: None  Significant Diagnostic Studies:  Results for orders placed or performed during the hospital encounter of 08/27/21 (from the past 168 hour(s))  CBC   Collection Time: 08/27/21  5:40 PM  Result Value Ref Range   WBC 10.2 4.0 - 10.5 K/uL   RBC 3.89 3.87 - 5.11 MIL/uL   Hemoglobin 11.6 (L) 12.0 - 15.0 g/dL   HCT 67.3 (L) 41.9 - 37.9 %   MCV 86.9 80.0 - 100.0 fL   MCH 29.8 26.0 - 34.0 pg   MCHC 34.3 30.0 - 36.0 g/dL   RDW 02.4 09.7 - 35.3 %   Platelets 277 150 - 400 K/uL   nRBC 0.0 0.0 - 0.2 %    Treatments: none  Hospital Course:  This is a 25 y.o. G2P0010 with IUP at [redacted]w[redacted]d seen for moderate vaginal bleeding in the third trimester. U/s showed no significant finding, and CBC was WNL. She was observed through the night with no further bleeding, labs were normal, and U/s showed no significant findings.  Fetal heart rate monitoring remained reassuring.  She was deemed stable for discharge to home with outpatient follow up.  Discharge Physical Exam:  BP 103/64 (BP Location: Left Arm)   Pulse 91   Temp 98.4 F (36.9 C) (Oral)   Resp 16   Ht 5\' 5"  (1.651 m)   Wt (!) 144.2 kg   LMP 01/05/2021 Comment: EDD based off LMP  SpO2 100%   BMI 52.92 kg/m   General: NAD CV: RRR Pulm: CTABL, nl effort ABD: s/nd/nt, gravid DVT Evaluation: LE non-ttp, no  evidence of DVT on exam.  NST: FHR baseline: 135 bpm Variability: moderate Accelerations: yes Decelerations: none Category/reactivity: reactive Amniotic Fluid Index: 11.4 cm   TOCO: quiet SVE: deferred      Discharge Condition: Stable  Disposition: Discharge disposition: 01-Home or Self Care       Allergies as of 08/28/2021   No Known Allergies      Medication List     STOP taking these medications    naproxen 375 MG tablet Commonly known as: NAPROSYN       TAKE these medications    aspirin 81 MG chewable tablet Chew by mouth daily.   ondansetron 4 MG tablet Commonly known as: ZOFRAN Take 4 mg by mouth every 8 (eight) hours as needed for nausea or vomiting.   prenatal multivitamin Tabs tablet Take 1 tablet by mouth daily at 12 noon.         Signed13/12/2020, CNM 08/28/2021 8:38 AM

## 2021-08-29 ENCOUNTER — Telehealth: Payer: Self-pay

## 2021-08-29 NOTE — Telephone Encounter (Signed)
Transition Care Management Unsuccessful Follow-up Telephone Call  Date of discharge and from where:  08/28/2021-[facility]  Attempts:  1st Attempt  Reason for unsuccessful TCM follow-up call:  Left voice message

## 2021-09-01 NOTE — Telephone Encounter (Signed)
Transition Care Management Unsuccessful Follow-up Telephone Call  Date of discharge and from where:  08/28/2021 from [facility]  Attempts:  2nd Attempt  Reason for unsuccessful TCM follow-up call:  Left voice message

## 2021-09-02 ENCOUNTER — Encounter: Payer: BC Managed Care – PPO | Admitting: Physical Therapy

## 2021-09-02 NOTE — Telephone Encounter (Signed)
Transition Care Management Unsuccessful Follow-up Telephone Call  Date of discharge and from where:  08/28/2021 from [facility]  Attempts:  3rd Attempt  Reason for unsuccessful TCM follow-up call:  Unable to reach patient

## 2021-09-04 ENCOUNTER — Encounter: Payer: BC Managed Care – PPO | Admitting: Physical Therapy

## 2021-09-08 DIAGNOSIS — Z6841 Body Mass Index (BMI) 40.0 and over, adult: Secondary | ICD-10-CM | POA: Diagnosis not present

## 2021-09-08 DIAGNOSIS — O09893 Supervision of other high risk pregnancies, third trimester: Secondary | ICD-10-CM | POA: Diagnosis not present

## 2021-09-08 DIAGNOSIS — O99213 Obesity complicating pregnancy, third trimester: Secondary | ICD-10-CM | POA: Diagnosis not present

## 2021-09-09 ENCOUNTER — Encounter: Payer: Self-pay | Admitting: Physical Therapy

## 2021-09-09 ENCOUNTER — Other Ambulatory Visit: Payer: Self-pay

## 2021-09-09 ENCOUNTER — Ambulatory Visit: Payer: BC Managed Care – PPO | Admitting: Physical Therapy

## 2021-09-09 DIAGNOSIS — M533 Sacrococcygeal disorders, not elsewhere classified: Secondary | ICD-10-CM | POA: Diagnosis not present

## 2021-09-09 DIAGNOSIS — O26893 Other specified pregnancy related conditions, third trimester: Secondary | ICD-10-CM

## 2021-09-09 DIAGNOSIS — R102 Pelvic and perineal pain: Secondary | ICD-10-CM | POA: Diagnosis not present

## 2021-09-09 DIAGNOSIS — M25559 Pain in unspecified hip: Secondary | ICD-10-CM | POA: Diagnosis not present

## 2021-09-09 DIAGNOSIS — M5417 Radiculopathy, lumbosacral region: Secondary | ICD-10-CM

## 2021-09-09 NOTE — Therapy (Signed)
[facility] [address] [place], [place], [address] Phone: [phone]   Fax:  [phone]  Physical Therapy Treatment  Patient Details  Name: [name] MRN: [id number] Date of Birth: [birth year] Referring Provider (PT): Haroldine Laws   Encounter Date: 09/09/2021   PT End of Session - 09/09/21 0950     Visit Number 3    Number of Visits 12    Date for PT Re-Evaluation 09/30/21    Authorization Type IE: 08/19/2021    PT Start Time 0945    PT Stop Time 1025    PT Time Calculation (min) 40 min    Activity Tolerance Patient tolerated treatment well    Behavior During Therapy [facility] for tasks assessed/performed             History reviewed. No pertinent past medical history.  History reviewed. No pertinent surgical history.  There were no vitals filed for this visit.   Subjective Assessment - 09/09/21 0948     Subjective Patient notes that she has been very fatigued. Denies any new pain, but endorses continued pubc symphysis pain. Newman Pies squeezes have been helpful.    Currently in Pain? Yes    Pain Score 5     Pain Location Pelvis    Pain Orientation Medial            TREATMENT  Neuromuscular Re-education: Patient educated on positions to prepare for birth including supported child's pose and recumbent butterfly pose. Patient education on birthing mechanics for decreased strain on PFM and pubic symphysis joint. Physioball pelvic mobility sequence: Pelvic Tilt on Swiss Ball - 10 reps Seated Lateral Pelvic Tilt on Swiss Ball - 10 reps Pelvic Circles on Swiss [place] - 10 reps Seated Lateral Trunk Stretch on Swiss Charity fundraiser on Swiss Ball Seated Calf Stretch on Swiss Ball Seated Hip Chief Financial Officer on Swiss Ball Seated Hip Recruitment consultant on [employer]   Patient educated throughout session on appropriate technique and form using multi-modal cueing, HEP, and activity modification. Patient  articulated understanding and returned demonstration.  Patient Response to interventions: 5/10 pain  ASSESSMENT Patient presents to clinic with excellent motivation to participate in therapy. Patient demonstrates deficits in pain, posture, ROM, nerve irritation, PFM coordination, PFM strength, and mobility. Patient able to achieve basic understanding of birthing mechanics and birthing positions for decreased strain on PFM during today's session and responded positively to active postural interventions. Patient will benefit from continued skilled therapeutic intervention to address remaining deficits in pain, posture, ROM, nerve irritation, PFM coordination, PFM strength, and mobility in order to increase function and improve overall QOL.     PT Long Term Goals - 08/19/21 1144       PT LONG TERM GOAL #1   Title Patient will demonstrate >/= 35 point change (61) in the FOTO outcome measure in order to represent a clinically significant change that ultimately will reflect an improved QOL.    Baseline IE: 26    Time 6    Period Weeks    Status New    Target Date 09/30/21      PT LONG TERM GOAL #2   Title Patient will decrease worst pain as reported on NPRS by at least 2 points to demonstrate clinically significant reduction in pain in order to restore/improve function and overall QOL.    Baseline IE: 9/10    Time 6    Period Weeks    Status New    Target  Date 09/30/21      PT LONG TERM GOAL #3   Title Patient will be able to rest comfortably in preferred position (sitting/supine) for more than 10 minutes with limited pain or radicular symptoms in B LEs in order to improve sleep hygiene and allow for minimal disruption in ADLs.    Baseline IE: 10 mins or less    Time 6    Period Weeks    Status New    Target Date 09/30/21      PT LONG TERM GOAL #4   Title Patient will report being able to return to activities including, but not limited to: grocery shopping or light activities in the  home with a little bit of limitation to indicate progression towards resolution of the chief complaint and return to prior level of participation at home and in the community.    Baseline IE: extreme difficulty (FOTO)    Time 6    Period Weeks    Status New    Target Date 09/30/21                   Plan - 09/09/21 0950     Clinical Impression Statement Patient presents to clinic with excellent motivation to participate in therapy. Patient demonstrates deficits in pain, posture, ROM, nerve irritation, PFM coordination, PFM strength, and mobility. Patient able to achieve basic understanding of birthing mechanics and birthing positions for decreased strain on PFM during today's session and responded positively to active postural interventions. Patient will benefit from continued skilled therapeutic intervention to address remaining deficits in pain, posture, ROM, nerve irritation, PFM coordination, PFM strength, and mobility in order to increase function and improve overall QOL.    Personal Factors and Comorbidities Comorbidity 1;Time since onset of injury/illness/exacerbation    Comorbidities subchorionic hemorrhage of placenta in 1st trimester    Examination-Activity Limitations Sit;Transfers;Bed Mobility;Sleep;Bathing;Bend;Lift;Squat;Locomotion Level;Stand;Carry    Examination-Participation Restrictions Interpersonal Relationship;Occupation;Cleaning;Laundry;Driving;Shop;Community Activity    Stability/Clinical Decision Making Evolving/Moderate complexity    Rehab Potential Good    PT Frequency 2x / week    PT Duration 6 weeks    PT Treatment/Interventions ADLs/Self Care Home Management;Cryotherapy;Moist Heat;Electrical Stimulation;Therapeutic activities;Functional mobility training;Gait training;Stair training;Therapeutic exercise;Balance training;Neuromuscular re-education;Patient/family education;Orthotic Fit/Training;Scar mobilization;Manual techniques;Compression bandaging;Passive  range of motion;Energy conservation;Taping    PT Next Visit Plan positions to prepare for birth, birthing body mechanics    PT Home Exercise Plan [id number]    Consulted and Agree with Plan of Care Patient             Patient will benefit from skilled therapeutic intervention in order to improve the following deficits and impairments:  Abnormal gait, Difficulty walking, Increased muscle spasms, Improper body mechanics, Decreased activity tolerance, Decreased coordination, Decreased strength, Hypermobility, Postural dysfunction, Pain  Visit Diagnosis: Radiculopathy, lumbosacral region  Pregnancy related hip pain, antepartum, third trimester  Pregnancy related symphysis pain, antepartum, third trimester  Sacroiliac pain during pregnancy     Problem List There are no problems to display for this patient.   Sheria Lang PT, DPT [phone]  09/09/2021, 10:26 AM  [facility] [address] [place], [place], [address] Phone: [phone]   Fax:  [phone]  Name: [name] MRN: [id number] Date of Birth: 1996/08/19

## 2021-09-11 ENCOUNTER — Encounter: Payer: BC Managed Care – PPO | Admitting: Physical Therapy

## 2021-09-15 DIAGNOSIS — O99213 Obesity complicating pregnancy, third trimester: Secondary | ICD-10-CM | POA: Diagnosis not present

## 2021-09-15 DIAGNOSIS — Z6841 Body Mass Index (BMI) 40.0 and over, adult: Secondary | ICD-10-CM | POA: Diagnosis not present

## 2021-09-16 ENCOUNTER — Ambulatory Visit: Payer: BC Managed Care – PPO | Admitting: Physical Therapy

## 2021-09-23 ENCOUNTER — Ambulatory Visit: Payer: BC Managed Care – PPO | Admitting: Physical Therapy

## 2021-09-23 DIAGNOSIS — O09893 Supervision of other high risk pregnancies, third trimester: Secondary | ICD-10-CM | POA: Diagnosis not present

## 2021-09-23 DIAGNOSIS — R7989 Other specified abnormal findings of blood chemistry: Secondary | ICD-10-CM | POA: Diagnosis not present

## 2021-09-23 DIAGNOSIS — Z6841 Body Mass Index (BMI) 40.0 and over, adult: Secondary | ICD-10-CM | POA: Diagnosis not present

## 2021-09-23 DIAGNOSIS — R946 Abnormal results of thyroid function studies: Secondary | ICD-10-CM | POA: Diagnosis not present

## 2021-09-23 DIAGNOSIS — O99213 Obesity complicating pregnancy, third trimester: Secondary | ICD-10-CM | POA: Diagnosis not present

## 2021-09-23 LAB — OB RESULTS CONSOLE RPR: RPR: NONREACTIVE

## 2021-09-23 LAB — OB RESULTS CONSOLE GBS: GBS: POSITIVE

## 2021-09-23 LAB — OB RESULTS CONSOLE HIV ANTIBODY (ROUTINE TESTING): HIV: NONREACTIVE

## 2021-09-24 ENCOUNTER — Other Ambulatory Visit: Payer: Self-pay | Admitting: Certified Nurse Midwife

## 2021-09-24 DIAGNOSIS — O0973 Supervision of high risk pregnancy due to social problems, third trimester: Secondary | ICD-10-CM

## 2021-09-25 ENCOUNTER — Other Ambulatory Visit
Admission: RE | Admit: 2021-09-25 | Discharge: 2021-09-25 | Disposition: A | Discharge status: Home / Self Care | Payer: BC Managed Care – PPO | Source: Ambulatory Visit | Attending: Certified Nurse Midwife | Admitting: Certified Nurse Midwife

## 2021-09-25 ENCOUNTER — Encounter: Payer: BC Managed Care – PPO | Admitting: Physical Therapy

## 2021-09-25 ENCOUNTER — Other Ambulatory Visit: Payer: Self-pay

## 2021-09-25 DIAGNOSIS — Z01812 Encounter for preprocedural laboratory examination: Secondary | ICD-10-CM | POA: Insufficient documentation

## 2021-09-25 DIAGNOSIS — Z20822 Contact with and (suspected) exposure to covid-19: Secondary | ICD-10-CM | POA: Insufficient documentation

## 2021-09-26 LAB — SARS CORONAVIRUS 2 (TAT 6-24 HRS): SARS Coronavirus 2: NEGATIVE

## 2021-09-27 ENCOUNTER — Encounter: Payer: Self-pay | Admitting: Obstetrics and Gynecology

## 2021-09-27 ENCOUNTER — Other Ambulatory Visit: Payer: Self-pay

## 2021-09-27 ENCOUNTER — Observation Stay
Admission: EM | Admit: 2021-09-27 | Discharge: 2021-09-27 | Disposition: A | Discharge status: Home / Self Care | Payer: BC Managed Care – PPO | Attending: Certified Nurse Midwife | Admitting: Certified Nurse Midwife

## 2021-09-27 DIAGNOSIS — O99213 Obesity complicating pregnancy, third trimester: Secondary | ICD-10-CM | POA: Insufficient documentation

## 2021-09-27 DIAGNOSIS — O3663X0 Maternal care for excessive fetal growth, third trimester, not applicable or unspecified: Principal | ICD-10-CM | POA: Insufficient documentation

## 2021-09-27 DIAGNOSIS — Z3A37 37 weeks gestation of pregnancy: Secondary | ICD-10-CM | POA: Insufficient documentation

## 2021-09-27 DIAGNOSIS — Z3490 Encounter for supervision of normal pregnancy, unspecified, unspecified trimester: Secondary | ICD-10-CM | POA: Diagnosis present

## 2021-09-27 HISTORY — DX: Other specified health status: Z78.9

## 2021-09-27 LAB — CBC
HCT: 35.1 % — ABNORMAL LOW (ref 36.0–46.0)
Hemoglobin: 11.8 g/dL — ABNORMAL LOW (ref 12.0–15.0)
MCH: 29.4 pg (ref 26.0–34.0)
MCHC: 33.6 g/dL (ref 30.0–36.0)
MCV: 87.3 fL (ref 80.0–100.0)
Platelets: 275 10*3/uL (ref 150–400)
RBC: 4.02 MIL/uL (ref 3.87–5.11)
RDW: 13.9 % (ref 11.5–15.5)
WBC: 9.4 10*3/uL (ref 4.0–10.5)
nRBC: 0 % (ref 0.0–0.2)

## 2021-09-27 LAB — TYPE AND SCREEN
ABO/RH(D): O NEG
Antibody Screen: NEGATIVE

## 2021-09-27 LAB — RPR: RPR Ser Ql: NONREACTIVE

## 2021-09-27 MED ORDER — OXYCODONE-ACETAMINOPHEN 5-325 MG PO TABS: 1.0000 | ORAL_TABLET | ORAL | Status: DC | PRN

## 2021-09-27 MED ORDER — OXYTOCIN BOLUS FROM INFUSION: 333.0000 mL | Freq: Once | INTRAVENOUS | Status: DC

## 2021-09-27 MED ORDER — LACTATED RINGERS IV SOLN: INTRAVENOUS | Status: DC

## 2021-09-27 MED ORDER — SOD CITRATE-CITRIC ACID 500-334 MG/5ML PO SOLN: 30.0000 mL | ORAL | Status: DC | PRN

## 2021-09-27 MED ORDER — ONDANSETRON HCL 4 MG/2ML IJ SOLN: 4.0000 mg | Freq: Four times a day (QID) | INTRAMUSCULAR | Status: DC | PRN

## 2021-09-27 MED ORDER — LACTATED RINGERS IV SOLN
500.0000 mL | INTRAVENOUS | Status: DC | PRN
Start: 2021-09-27 — End: 2021-09-27

## 2021-09-27 MED ORDER — PENICILLIN G POT IN DEXTROSE 60000 UNIT/ML IV SOLN: 3.0000 10*6.[IU] | INTRAVENOUS | Status: DC

## 2021-09-27 MED ORDER — MISOPROSTOL 200 MCG PO TABS
ORAL_TABLET | ORAL | Status: AC
  Filled 2021-09-27: qty 4

## 2021-09-27 MED ORDER — ACETAMINOPHEN 325 MG PO TABS: 650.0000 mg | ORAL_TABLET | ORAL | Status: DC | PRN

## 2021-09-27 MED ORDER — AMMONIA AROMATIC IN INHA
RESPIRATORY_TRACT | Status: AC
  Filled 2021-09-27: qty 10

## 2021-09-27 MED ORDER — OXYTOCIN 10 UNIT/ML IJ SOLN
INTRAMUSCULAR | Status: AC
  Filled 2021-09-27: qty 2

## 2021-09-27 MED ORDER — OXYTOCIN-SODIUM CHLORIDE 30-0.9 UT/500ML-% IV SOLN
1.0000 m[IU]/min | INTRAVENOUS | Status: DC
Start: 2021-09-27 — End: 2021-09-27
  Filled 2021-09-27: qty 500

## 2021-09-27 MED ORDER — LIDOCAINE HCL (PF) 1 % IJ SOLN: 30.0000 mL | INTRAMUSCULAR | Status: DC | PRN

## 2021-09-27 MED ORDER — LIDOCAINE HCL (PF) 1 % IJ SOLN
INTRAMUSCULAR | Status: AC
  Filled 2021-09-27: qty 30

## 2021-09-27 MED ORDER — OXYCODONE-ACETAMINOPHEN 5-325 MG PO TABS: 2.0000 | ORAL_TABLET | ORAL | Status: DC | PRN

## 2021-09-27 MED ORDER — OXYTOCIN-SODIUM CHLORIDE 30-0.9 UT/500ML-% IV SOLN: 2.5000 [IU]/h | INTRAVENOUS | Status: DC

## 2021-09-27 MED ORDER — TERBUTALINE SULFATE 1 MG/ML IJ SOLN: 0.2500 mg | Freq: Once | INTRAMUSCULAR | Status: DC | PRN

## 2021-09-27 MED ORDER — SODIUM CHLORIDE 0.9 % IV SOLN: 5.0000 10*6.[IU] | Freq: Once | INTRAVENOUS | Status: DC

## 2021-09-27 MED ORDER — MISOPROSTOL 25 MCG QUARTER TABLET
25.0000 ug | ORAL_TABLET | ORAL | Status: DC | PRN
  Administered 2021-09-27: 25 ug via VAGINAL
  Filled 2021-09-27: qty 1

## 2021-09-27 NOTE — OB Triage Note (Signed)
Pt. discharged home in stable condition. Maternal discharge instructions and follow-up care reviewed-pt.verbalized understanding. Pt. discharged home in private vehicle with support person. Pt. stable at discharge. Induction instructions and labor precautions reviewed- pt verbalized understanding.

## 2021-09-27 NOTE — Discharge Summary (Signed)
Patient ID: [name] MRN: [id number] DOB/AGE: 29-Nov-1995 25 y.o.  Admit date: 09/27/2021 Discharge date: 09/27/2021  Admission Diagnoses: elective IOL for LGA at 37wks  Discharge Diagnoses: 37wks, morbid obesity, LGA infant.   Prenatal Procedures: NST  Consults: w/ attending OB Dr Hildred Laser  Significant Diagnostic Studies:  Results for orders placed or performed during the hospital encounter of 09/27/21 (from the past 168 hour(s))  OB RESULT CONSOLE Group B Strep   Collection Time: 09/23/21 12:00 AM  Result Value Ref Range   GBS Positive   OB RESULTS CONSOLE RPR   Collection Time: 09/23/21 12:00 AM  Result Value Ref Range   RPR Nonreactive   OB RESULTS CONSOLE HIV antibody   Collection Time: 09/23/21 12:00 AM  Result Value Ref Range   HIV Non-reactive   CBC   Collection Time: 09/27/21  5:44 AM  Result Value Ref Range   WBC 9.4 4.0 - 10.5 K/uL   RBC 4.02 3.87 - 5.11 MIL/uL   Hemoglobin 11.8 (L) 12.0 - 15.0 g/dL   HCT 16.0 (L) 73.7 - 10.6 %   MCV 87.3 80.0 - 100.0 fL   MCH 29.4 26.0 - 34.0 pg   MCHC 33.6 30.0 - 36.0 g/dL   RDW 26.9 48.5 - 46.2 %   Platelets 275 150 - 400 K/uL   nRBC 0.0 0.0 - 0.2 %  Type and screen [facility]   Collection Time: 09/27/21  5:44 AM  Result Value Ref Range   ABO/RH(D) O NEG    Antibody Screen NEG    Sample Expiration      09/30/2021,2359 Performed at [facility], [address] [place], [place] [address]   Results for orders placed or performed during the hospital encounter of 09/25/21 (from the past 168 hour(s))  SARS CORONAVIRUS 2 (TAT 6-24 HRS) Nasopharyngeal Nasopharyngeal Swab   Collection Time: 09/25/21 11:22 AM   Specimen: Nasopharyngeal Swab  Result Value Ref Range   SARS Coronavirus 2 NEGATIVE NEGATIVE    Treatments: Cytotec x 1 dose.   Hospital Course:  This is a 25 y.o. G2P0010 with IUP at [redacted]w[redacted]d admitted for induction of labor due to EFW 87% on recent US. Pt had one dose of  cytotec at 0625 this am, noted to have a cervical exam of closed/thk/high.  No leaking of fluid and no bleeding. Discussed POC with Dr Valentino Saxon, attending OB, pt with increased risk of failed IOL due to poor bishop score at early term, and increased risk of SCN admission for infant at 37.0wks  She was observed, fetal heart rate monitoring remained reassuring. Her cervical exam was unchanged from admission. She was deemed stable for discharge to home with outpatient follow up. Discussed routine labor precautions, scheduled pt for 39wks IOL due to obesity.   Discharge Physical Exam:  BP 113/68 (BP Location: Right Arm)   Pulse 91   Temp 98.7 F (37.1 C) (Oral)   Resp 18   Ht 5\' 5"  (1.651 m)   Wt (!) 151.5 kg   LMP 01/05/2021 Comment: EDD based off LMP  BMI 55.58 kg/m   General: NAD CV: RRR Pulm: CTABL, nl effort ABD: s/nd/nt, gravid DVT Evaluation: LE non-ttp, no evidence of DVT on exam.  SVE: Closed/50/-3; cervix posterior, medium consistency.   FHT: 130bpm, mod variability, + accels, no decels TOCO: occasional UI   Discharge Condition: Stable  Disposition:  Discharge disposition: 01-Home or Self Care       Allergies as of 09/27/2021  No Known Allergies      Medication List     TAKE these medications    acetaminophen 325 MG tablet Commonly known as: TYLENOL Take 2 tablets (650 mg total) by mouth every 4 (four) hours as needed (for pain scale < 4  OR  temperature  >/=  100.5 F).   aspirin 81 MG chewable tablet Chew by mouth daily.   ondansetron 4 MG tablet Commonly known as: ZOFRAN Take 4 mg by mouth every 8 (eight) hours as needed for nausea or vomiting.   prenatal multivitamin Tabs tablet Take 1 tablet by mouth daily at 12 noon.        Follow-up Information     [facility] Follow up.   Why: make PNC appt for this week Contact information: [address] [place] [address] [phone]                 Signed:  Randa Ngo, CNM 09/27/2021  10:50 AM

## 2021-09-29 ENCOUNTER — Telehealth: Payer: Self-pay

## 2021-09-29 DIAGNOSIS — Z6841 Body Mass Index (BMI) 40.0 and over, adult: Secondary | ICD-10-CM | POA: Diagnosis not present

## 2021-09-29 DIAGNOSIS — O09893 Supervision of other high risk pregnancies, third trimester: Secondary | ICD-10-CM | POA: Diagnosis not present

## 2021-09-29 DIAGNOSIS — O99213 Obesity complicating pregnancy, third trimester: Secondary | ICD-10-CM | POA: Diagnosis not present

## 2021-09-29 NOTE — Telephone Encounter (Signed)
Transition Care Management Unsuccessful Follow-up Telephone Call  Date of discharge and from where:  09/27/2021 from [facility]  Attempts:  1st Attempt  Reason for unsuccessful TCM follow-up call:  Left voice message

## 2021-09-30 NOTE — Telephone Encounter (Signed)
Transition Care Management Unsuccessful Follow-up Telephone Call  Date of discharge and from where:  09/27/2021 from [facility]  Attempts:  2nd Attempt  Reason for unsuccessful TCM follow-up call:  Left voice message

## 2021-10-01 NOTE — Telephone Encounter (Signed)
Transition Care Management Unsuccessful Follow-up Telephone Call  Date of discharge and from where:  09/27/2021 from [facility]  Attempts:  3rd Attempt  Reason for unsuccessful TCM follow-up call:  Unable to reach patient  .

## 2021-10-06 DIAGNOSIS — O99213 Obesity complicating pregnancy, third trimester: Secondary | ICD-10-CM | POA: Diagnosis not present

## 2021-10-06 DIAGNOSIS — O09893 Supervision of other high risk pregnancies, third trimester: Secondary | ICD-10-CM | POA: Diagnosis not present

## 2021-10-06 DIAGNOSIS — O3663X0 Maternal care for excessive fetal growth, third trimester, not applicable or unspecified: Secondary | ICD-10-CM | POA: Diagnosis not present

## 2021-10-10 ENCOUNTER — Other Ambulatory Visit: Payer: Self-pay

## 2021-10-10 ENCOUNTER — Encounter: Payer: Self-pay | Admitting: Obstetrics and Gynecology

## 2021-10-10 ENCOUNTER — Observation Stay
Admission: EM | Admit: 2021-10-10 | Discharge: 2021-10-10 | Disposition: A | Discharge status: Home / Self Care | Payer: BC Managed Care – PPO | Source: Home / Self Care | Admitting: Obstetrics and Gynecology

## 2021-10-10 DIAGNOSIS — O468X3 Other antepartum hemorrhage, third trimester: Secondary | ICD-10-CM | POA: Diagnosis not present

## 2021-10-10 DIAGNOSIS — Z3A38 38 weeks gestation of pregnancy: Secondary | ICD-10-CM | POA: Insufficient documentation

## 2021-10-10 DIAGNOSIS — E669 Obesity, unspecified: Secondary | ICD-10-CM | POA: Insufficient documentation

## 2021-10-10 DIAGNOSIS — Z6841 Body Mass Index (BMI) 40.0 and over, adult: Secondary | ICD-10-CM | POA: Insufficient documentation

## 2021-10-10 DIAGNOSIS — O26853 Spotting complicating pregnancy, third trimester: Secondary | ICD-10-CM | POA: Insufficient documentation

## 2021-10-10 DIAGNOSIS — R109 Unspecified abdominal pain: Secondary | ICD-10-CM | POA: Insufficient documentation

## 2021-10-10 DIAGNOSIS — O4693 Antepartum hemorrhage, unspecified, third trimester: Secondary | ICD-10-CM | POA: Diagnosis present

## 2021-10-10 DIAGNOSIS — O26893 Other specified pregnancy related conditions, third trimester: Secondary | ICD-10-CM | POA: Insufficient documentation

## 2021-10-10 DIAGNOSIS — O99213 Obesity complicating pregnancy, third trimester: Secondary | ICD-10-CM | POA: Insufficient documentation

## 2021-10-10 LAB — URINALYSIS, COMPLETE (UACMP) WITH MICROSCOPIC
Bacteria, UA: NONE SEEN
Bilirubin Urine: NEGATIVE
Glucose, UA: NEGATIVE mg/dL
Ketones, ur: NEGATIVE mg/dL
Leukocytes,Ua: NEGATIVE
Nitrite: NEGATIVE
Protein, ur: NEGATIVE mg/dL
Specific Gravity, Urine: 1.02 (ref 1.005–1.030)
pH: 6 (ref 5.0–8.0)

## 2021-10-10 LAB — WET PREP, GENITAL
Clue Cells Wet Prep HPF POC: NONE SEEN
Sperm: NONE SEEN
Trich, Wet Prep: NONE SEEN
WBC, Wet Prep HPF POC: <10 (ref <10)
Yeast Wet Prep HPF POC: NONE SEEN

## 2021-10-10 NOTE — Discharge Summary (Addendum)
Patient ID: Merina Behrendt MRN: [id number] DOB/AGE: August 09, 1996 25 y.o.  Admit date: 10/10/2021 Discharge date: 10/10/2021  Admission Diagnoses: 25yo G2P0 at [redacted]w[redacted]d present with an episode of VB and mild cramping.  Discharge Diagnoses: No VB and not in labor  Factors complicating pregnancy: 1. RH Negative  2. Abnormal TSH 2.87 3. Varicella and Rubella Non-Immune 4. Obesity BMI: 50.62  Prenatal Procedures: NST  Consults: None  Significant Diagnostic Studies:  Results for orders placed or performed during the hospital encounter of 10/10/21 (from the past 168 hour(s))  Wet prep, genital   Collection Time: 10/10/21 11:21 AM  Result Value Ref Range   Yeast Wet Prep HPF POC NONE SEEN NONE SEEN   Trich, Wet Prep NONE SEEN NONE SEEN   Clue Cells Wet Prep HPF POC NONE SEEN NONE SEEN   WBC, Wet Prep HPF POC <10 <10   Sperm NONE SEEN   Urinalysis, Complete w Microscopic Urine, Clean Catch   Collection Time: 10/10/21 11:21 AM  Result Value Ref Range   Color, Urine YELLOW YELLOW   APPearance CLEAR CLEAR   Specific Gravity, Urine 1.020 1.005 - 1.030   pH 6.0 5.0 - 8.0   Glucose, UA NEGATIVE NEGATIVE mg/dL   Hgb urine dipstick SMALL (A) NEGATIVE   Bilirubin Urine NEGATIVE NEGATIVE   Ketones, ur NEGATIVE NEGATIVE mg/dL   Protein, ur NEGATIVE NEGATIVE mg/dL   Nitrite NEGATIVE NEGATIVE   Leukocytes,Ua NEGATIVE NEGATIVE   RBC / HPF 0-5 0 - 5 RBC/hpf   WBC, UA 0-5 0 - 5 WBC/hpf   Bacteria, UA NONE SEEN NONE SEEN   Squamous Epithelial / LPF 6-10 0 - 5   Mucus PRESENT    Ca Oxalate Crys, UA PRESENT     Treatments: none  Hospital Course:  This is a 25 y.o. G2P0010 with IUP at [redacted]w[redacted]d seen for VB and labor check, noted to have a cervical exam of FT/TH/-3 with a little bloody show.  No leaking of fluid.  She was observed, fetal heart rate monitoring remained reassuring, and she had no signs/symptoms of progressing labor or other maternal-fetal concerns.  She was deemed stable for discharge  to home with outpatient follow up.  Discharge Physical Exam:  BP 114/78 (BP Location: Left Arm)    Pulse (!) 107    Temp 98.4 F (36.9 C) (Oral)    Resp 18    Ht 5\' 5"  (1.651 m)    Wt (!) 151.5 kg    LMP 01/05/2021 Comment: EDD based off LMP   BMI 55.58 kg/m   General: NAD CV: RRR Pulm: nl effort ABD: s/nd/nt, gravid DVT Evaluation: LE non-ttp, no evidence of DVT on exam.  NST: FHR baseline: 135 bpm Variability: moderate Accelerations: yes Decelerations: none Category/reactivity: reactive   TOCO: quiet SVE:  Dilation: Fingertip Effacement (%): Thick Cervical Position: Posterior Station: -3 Presentation: Vertex Exam by:: LSE   Discharge Condition: Stable  Disposition: Discharge disposition: 01-Home or Self Care        Allergies as of 10/10/2021   No Known Allergies      Medication List     TAKE these medications    acetaminophen 325 MG tablet Commonly known as: TYLENOL Take 2 tablets (650 mg total) by mouth every 4 (four) hours as needed (for pain scale < 4  OR  temperature  >/=  100.5 F).   aspirin 81 MG chewable tablet Chew by mouth daily.   ondansetron 4 MG tablet Commonly known as: ZOFRAN Take 4 mg  by mouth every 8 (eight) hours as needed for nausea or vomiting.   prenatal multivitamin Tabs tablet Take 1 tablet by mouth daily at 12 noon.        Follow-up Information     [facility]. Go to.   Why: Enter through [facility] @ 8:00 am. Labor & Delivery will be expecting you.  We will se you Sunday! Contact information: [address] [place] [address]                Signed:  Quillian Quince 10/11/2021 8:26 AM

## 2021-10-10 NOTE — Progress Notes (Signed)
Discharged home accompanied by support person. Reviewed labor precaution, vaginal bleeding, pain control during labor, and induction of labor. Left floor ambulatory. [name]

## 2021-10-10 NOTE — OB Triage Note (Signed)
Pt co vaginal bleeding @ 0630 this am x 1 occurrence. Pt stated she had bright red blood in the toilet and on the toilet paper. Denies any bleeding since 0630 this am. Pt reports mild cramping noted since bleeding episode. Pt also reports + fetal movement. Elaina Hoops

## 2021-10-12 ENCOUNTER — Other Ambulatory Visit: Payer: Self-pay

## 2021-10-12 ENCOUNTER — Inpatient Hospital Stay
Admission: EM | Admit: 2021-10-12 | Discharge: 2021-10-15 | DRG: 787 | Disposition: A | Discharge status: Home / Self Care | Payer: BC Managed Care – PPO | Attending: Obstetrics and Gynecology | Admitting: Obstetrics and Gynecology

## 2021-10-12 ENCOUNTER — Inpatient Hospital Stay: Payer: BC Managed Care – PPO | Admitting: Anesthesiology

## 2021-10-12 ENCOUNTER — Encounter: Payer: Self-pay | Admitting: Obstetrics and Gynecology

## 2021-10-12 DIAGNOSIS — Z7982 Long term (current) use of aspirin: Secondary | ICD-10-CM | POA: Diagnosis not present

## 2021-10-12 DIAGNOSIS — O9081 Anemia of the puerperium: Secondary | ICD-10-CM | POA: Diagnosis not present

## 2021-10-12 DIAGNOSIS — Z6791 Unspecified blood type, Rh negative: Secondary | ICD-10-CM

## 2021-10-12 DIAGNOSIS — O99820 Streptococcus B carrier state complicating pregnancy: Secondary | ICD-10-CM | POA: Diagnosis not present

## 2021-10-12 DIAGNOSIS — O99213 Obesity complicating pregnancy, third trimester: Secondary | ICD-10-CM | POA: Diagnosis not present

## 2021-10-12 DIAGNOSIS — Z23 Encounter for immunization: Secondary | ICD-10-CM | POA: Diagnosis not present

## 2021-10-12 DIAGNOSIS — O26893 Other specified pregnancy related conditions, third trimester: Secondary | ICD-10-CM | POA: Diagnosis not present

## 2021-10-12 DIAGNOSIS — Z3A39 39 weeks gestation of pregnancy: Secondary | ICD-10-CM

## 2021-10-12 DIAGNOSIS — Z3490 Encounter for supervision of normal pregnancy, unspecified, unspecified trimester: Secondary | ICD-10-CM | POA: Diagnosis present

## 2021-10-12 DIAGNOSIS — Z20822 Contact with and (suspected) exposure to covid-19: Secondary | ICD-10-CM | POA: Diagnosis present

## 2021-10-12 DIAGNOSIS — E038 Other specified hypothyroidism: Secondary | ICD-10-CM | POA: Diagnosis not present

## 2021-10-12 DIAGNOSIS — O26843 Uterine size-date discrepancy, third trimester: Secondary | ICD-10-CM | POA: Diagnosis present

## 2021-10-12 DIAGNOSIS — O99284 Endocrine, nutritional and metabolic diseases complicating childbirth: Secondary | ICD-10-CM | POA: Diagnosis present

## 2021-10-12 DIAGNOSIS — O3663X0 Maternal care for excessive fetal growth, third trimester, not applicable or unspecified: Secondary | ICD-10-CM | POA: Diagnosis not present

## 2021-10-12 DIAGNOSIS — D62 Acute posthemorrhagic anemia: Secondary | ICD-10-CM | POA: Diagnosis not present

## 2021-10-12 DIAGNOSIS — O99214 Obesity complicating childbirth: Secondary | ICD-10-CM | POA: Diagnosis present

## 2021-10-12 DIAGNOSIS — O99824 Streptococcus B carrier state complicating childbirth: Secondary | ICD-10-CM | POA: Diagnosis not present

## 2021-10-12 DIAGNOSIS — Z9889 Other specified postprocedural states: Secondary | ICD-10-CM

## 2021-10-12 LAB — CBC
HCT: 36.1 % (ref 36.0–46.0)
Hemoglobin: 12.5 g/dL (ref 12.0–15.0)
MCH: 29.5 pg (ref 26.0–34.0)
MCHC: 34.6 g/dL (ref 30.0–36.0)
MCV: 85.1 fL (ref 80.0–100.0)
Platelets: 281 10*3/uL (ref 150–400)
RBC: 4.24 MIL/uL (ref 3.87–5.11)
RDW: 14.4 % (ref 11.5–15.5)
WBC: 10.9 10*3/uL — ABNORMAL HIGH (ref 4.0–10.5)
nRBC: 0 % (ref 0.0–0.2)

## 2021-10-12 LAB — TYPE AND SCREEN
ABO/RH(D): O NEG
Antibody Screen: NEGATIVE

## 2021-10-12 LAB — RESP PANEL BY RT-PCR (FLU A&B, COVID) ARPGX2
Influenza A by PCR: NEGATIVE
Influenza B by PCR: NEGATIVE
SARS Coronavirus 2 by RT PCR: NEGATIVE

## 2021-10-12 MED ORDER — FENTANYL-BUPIVACAINE-NACL 0.5-0.125-0.9 MG/250ML-% EP SOLN
12.0000 mL/h | EPIDURAL | Status: DC | PRN
  Administered 2021-10-12: 23:00:00 12 mL/h via EPIDURAL

## 2021-10-12 MED ORDER — DIPHENHYDRAMINE HCL 50 MG/ML IJ SOLN: 12.5000 mg | INTRAMUSCULAR | Status: DC | PRN

## 2021-10-12 MED ORDER — PHENYLEPHRINE 40 MCG/ML (10ML) SYRINGE FOR IV PUSH (FOR BLOOD PRESSURE SUPPORT): 80.0000 ug | PREFILLED_SYRINGE | INTRAVENOUS | Status: DC | PRN

## 2021-10-12 MED ORDER — PENICILLIN G POTASSIUM 5000000 UNITS IJ SOLR
2.5000 10*6.[IU] | INTRAVENOUS | Status: DC
  Filled 2021-10-12 (×4): qty 2.5

## 2021-10-12 MED ORDER — OXYTOCIN 10 UNIT/ML IJ SOLN
INTRAMUSCULAR | Status: AC
  Filled 2021-10-12: qty 2

## 2021-10-12 MED ORDER — MISOPROSTOL 200 MCG PO TABS
ORAL_TABLET | ORAL | Status: AC
  Filled 2021-10-12: qty 4

## 2021-10-12 MED ORDER — PENICILLIN G POTASSIUM 5000000 UNITS IJ SOLR
5.0000 10*6.[IU] | Freq: Once | INTRAMUSCULAR | Status: DC
  Filled 2021-10-12: qty 5

## 2021-10-12 MED ORDER — SOD CITRATE-CITRIC ACID 500-334 MG/5ML PO SOLN
30.0000 mL | ORAL | Status: DC | PRN
  Administered 2021-10-13 (×2): 30 mL via ORAL

## 2021-10-12 MED ORDER — PENICILLIN G POTASSIUM 5000000 UNITS IJ SOLR: 5.0000 10*6.[IU] | Freq: Once | INTRAMUSCULAR | Status: AC

## 2021-10-12 MED ORDER — MISOPROSTOL 25 MCG QUARTER TABLET
25.0000 ug | ORAL_TABLET | ORAL | Status: DC | PRN
  Administered 2021-10-12 (×2): 25 ug via VAGINAL
  Filled 2021-10-12 (×3): qty 1

## 2021-10-12 MED ORDER — OXYTOCIN BOLUS FROM INFUSION: 333.0000 mL | Freq: Once | INTRAVENOUS | Status: DC

## 2021-10-12 MED ORDER — ONDANSETRON HCL 4 MG/2ML IJ SOLN
4.0000 mg | Freq: Four times a day (QID) | INTRAMUSCULAR | Status: DC | PRN
  Administered 2021-10-13: 09:00:00 4 mg via INTRAVENOUS
  Filled 2021-10-12: qty 2

## 2021-10-12 MED ORDER — OXYTOCIN-SODIUM CHLORIDE 30-0.9 UT/500ML-% IV SOLN
2.5000 [IU]/h | INTRAVENOUS | Status: DC
  Filled 2021-10-12: qty 500

## 2021-10-12 MED ORDER — EPHEDRINE 5 MG/ML INJ: 10.0000 mg | INTRAVENOUS | Status: DC | PRN

## 2021-10-12 MED ORDER — TERBUTALINE SULFATE 1 MG/ML IJ SOLN: 0.2500 mg | Freq: Once | INTRAMUSCULAR | Status: DC | PRN

## 2021-10-12 MED ORDER — BUPIVACAINE HCL (PF) 0.25 % IJ SOLN
INTRAMUSCULAR | Status: DC | PRN
  Administered 2021-10-12: 3 mL via EPIDURAL
  Administered 2021-10-12: 5 mL via EPIDURAL

## 2021-10-12 MED ORDER — LACTATED RINGERS IV SOLN: INTRAVENOUS | Status: DC

## 2021-10-12 MED ORDER — PENICILLIN G POT IN DEXTROSE 60000 UNIT/ML IV SOLN
3.0000 10*6.[IU] | INTRAVENOUS | Status: DC
  Administered 2021-10-12 – 2021-10-13 (×3): 3 10*6.[IU] via INTRAVENOUS
  Filled 2021-10-12 (×10): qty 50

## 2021-10-12 MED ORDER — ACETAMINOPHEN 325 MG PO TABS
650.0000 mg | ORAL_TABLET | ORAL | Status: DC | PRN
  Administered 2021-10-12: 650 mg via ORAL
  Filled 2021-10-12: qty 2

## 2021-10-12 MED ORDER — OXYTOCIN-SODIUM CHLORIDE 30-0.9 UT/500ML-% IV SOLN
1.0000 m[IU]/min | INTRAVENOUS | Status: DC
  Administered 2021-10-12: 19:00:00 2 m[IU]/min via INTRAVENOUS
  Filled 2021-10-12: qty 1000

## 2021-10-12 MED ORDER — FENTANYL-BUPIVACAINE-NACL 0.5-0.125-0.9 MG/250ML-% EP SOLN
EPIDURAL | Status: AC
  Filled 2021-10-12: qty 250

## 2021-10-12 MED ORDER — AMMONIA AROMATIC IN INHA
RESPIRATORY_TRACT | Status: AC
  Filled 2021-10-12: qty 10

## 2021-10-12 MED ORDER — LACTATED RINGERS IV SOLN
500.0000 mL | Freq: Once | INTRAVENOUS | Status: AC
  Administered 2021-10-12: 23:00:00 500 mL via INTRAVENOUS

## 2021-10-12 MED ORDER — BUTORPHANOL TARTRATE 1 MG/ML IJ SOLN: 1.0000 mg | INTRAMUSCULAR | Status: DC | PRN

## 2021-10-12 MED ORDER — SODIUM CHLORIDE 0.9 % IV SOLN
INTRAVENOUS | Status: AC
  Filled 2021-10-12: qty 5

## 2021-10-12 MED ORDER — LIDOCAINE HCL (PF) 1 % IJ SOLN
30.0000 mL | INTRAMUSCULAR | Status: DC | PRN
  Filled 2021-10-12: qty 30

## 2021-10-12 MED ORDER — LIDOCAINE HCL (PF) 1 % IJ SOLN
INTRAMUSCULAR | Status: DC | PRN
  Administered 2021-10-12: 3 mL via SUBCUTANEOUS

## 2021-10-12 MED ORDER — LACTATED RINGERS IV SOLN
500.0000 mL | INTRAVENOUS | Status: DC | PRN
Start: 2021-10-12 — End: 2021-10-13
  Administered 2021-10-12 – 2021-10-13 (×2): 500 mL via INTRAVENOUS

## 2021-10-12 MED ORDER — LIDOCAINE-EPINEPHRINE (PF) 1.5 %-1:200000 IJ SOLN
INTRAMUSCULAR | Status: DC | PRN
  Administered 2021-10-12: 4 mL via EPIDURAL

## 2021-10-12 NOTE — Anesthesia Preprocedure Evaluation (Signed)
Anesthesia Evaluation  Patient identified by MRN, date of birth, ID band Patient awake    Reviewed: Allergy & Precautions, H&P , NPO status , Patient's Chart, lab work & pertinent test results, reviewed documented beta blocker date and time   Airway Mallampati: II  TM Distance: >3 FB Neck ROM: full    Dental no notable dental hx. (+) Teeth Intact   Pulmonary    Pulmonary exam normal breath sounds clear to auscultation       Cardiovascular Exercise Tolerance: Good negative cardio ROS   Rhythm:regular Rate:Normal     Neuro/Psych negative neurological ROS  negative psych ROS   GI/Hepatic negative GI ROS, Neg liver ROS,   Endo/Other  diabetesMorbid obesity  Renal/GU      Musculoskeletal   Abdominal   Peds  Hematology negative hematology ROS (+)   Anesthesia Other Findings   Reproductive/Obstetrics (+) Pregnancy                             Anesthesia Physical Anesthesia Plan  ASA: 3  Anesthesia Plan: Epidural   Post-op Pain Management:    Induction:   PONV Risk Score and Plan:   Airway Management Planned:   Additional Equipment:   Intra-op Plan:   Post-operative Plan:   Informed Consent: I have reviewed the patients History and Physical, chart, labs and discussed the procedure including the risks, benefits and alternatives for the proposed anesthesia with the patient or authorized representative who has indicated his/her understanding and acceptance.       Plan Discussed with:   Anesthesia Plan Comments:         Anesthesia Quick Evaluation

## 2021-10-12 NOTE — Progress Notes (Addendum)
Labor Progress Note  [name] is a 25 y.o. G2P0010 at [redacted]w[redacted]d by ultrasound admitted for induction of labor due to obesity and macrosomia.  Subjective: feeling mild cramping   Objective: BP 125/78    Pulse 91    Temp 98.9 F (37.2 C) (Oral)    Resp 16    Ht 5\' 5"  (1.651 m)    Wt (!) 151.5 kg Comment: 334lbs   LMP 01/05/2021 Comment: EDD based off LMP   BMI 55.58 kg/m  Notable VS details: reviewed   Fetal Assessment: FHT:  FHR: 140 bpm, variability: moderate,  accelerations:  Present,  decelerations:  Absent Category/reactivity:  Category I UC:   regular, every 2-3 minutes SVE:   2/70/-2 by RN Membrane status: Intact Amniotic color: N/A  Labs: Lab Results  Component Value Date   WBC 10.9 (H) 10/12/2021   HGB 12.5 10/12/2021   HCT 36.1 10/12/2021   MCV 85.1 10/12/2021   PLT 281 10/12/2021    Assessment / Plan: IOL for obesity and macrosomia -s/p cervical ripening with 2 doses of misoprostol  -Will start oxytocin  -Start PCN for GBS IP prophylaxis -Dr. 10/14/2021 updated on progress and plan   Labor: Progressing normally Fetal Wellbeing:  Category I Pain Control:  Labor support without medications I/D:   GBS positive - will start IP prophylaxis with PCN Anticipated MOD:  NSVD   Feliberto Gottron, CNM 10/12/2021, 7:43 PM

## 2021-10-12 NOTE — H&P (Signed)
OB History & Physical   History of Present Illness:   Chief Complaint: scheduled IOL  HPI:  [name] is a 25 y.o. G2P0010 female at [redacted]w[redacted]d dated by [place] at [redacted]w[redacted]d, not c/w LMP of 01/05/2021.  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  Varicella and rubella non-immune  Patient Active Problem List   Diagnosis Date Noted   Encounter for planned induction of labor 10/12/2021   Uterine size date discrepancy, third trimester 10/12/2021   Morbid obesity with BMI of 50.0-59.9, adult (HCC) 10/12/2021   Obesity affecting pregnancy in third trimester 10/06/2021   Macrosomia 10/06/2021   Supervision of other high risk pregnancies, third trimester 03/04/2021     Maternal Medical History:   Past Medical History:  Diagnosis Date   Medical history non-contributory     Past Surgical History:  Procedure Laterality Date   NO PAST SURGERIES     WISDOM TOOTH EXTRACTION      No Known Allergies  Prior to Admission medications   Medication Sig Start Date End Date Taking? Authorizing Provider  acetaminophen (TYLENOL) 325 MG tablet Take 2 tablets (650 mg total) by mouth every 4 (four) hours as needed (for pain scale < 4  OR  temperature  >/=  100.5 F). 09/27/21   McVey, Prudencio Pair, CNM  aspirin 81 MG chewable tablet Chew by mouth daily.    [provider]  ondansetron (ZOFRAN) 4 MG tablet Take 4 mg by mouth every 8 (eight) hours as needed for nausea or vomiting.    [provider]  Prenatal Vit-Fe Fumarate-FA (PRENATAL MULTIVITAMIN) TABS tablet Take 1 tablet by mouth daily at 12 noon.    [provider]     Prenatal care site:  [facility]  OB[facility]N  Social History: She  reports that she has never smoked. She has never used smokeless tobacco. She reports that she does not drink alcohol and does not use drugs.  Family History: family history includes Diabetes in her maternal aunt; Lupus in her mother.   Review of Systems: A full review of systems was performed and negative except as noted in the HPI.     Physical Exam:  Vital Signs: BP 131/82 (BP Location: Left Arm)    Pulse (!) 102    Temp 98 F (36.7 C) (Oral)    Resp 16    Ht 5\' 5"  (1.651 m)    Wt (!) 151.5 kg Comment: 334lbs   LMP 01/05/2021 Comment: EDD based off LMP   BMI 55.58 kg/m  Physical Exam  General: no acute distress.  HEENT: normocephalic, atraumatic Heart: regular rate & rhythm.  No murmurs/rubs/gallops Lungs: clear to auscultation bilaterally, normal respiratory effort Abdomen: soft, gravid, non-tender;  EFW: 9lbs Pelvic:   External: Normal external female genitalia  Cervix: Dilation: 1 / Effacement (%): 70 / Station: -2    Extremities: non-tender, symmetric, No edema bilaterally.  DTRs: 2+/2+  Neurologic: Alert & oriented x 3.    Results for orders placed or performed during the hospital encounter of 10/12/21 (from the past 24 hour(s))  CBC     Status: Abnormal   Collection Time: 10/12/21  9:11 AM  Result Value Ref Range   WBC  10.9 (H) 4.0 - 10.5 K/uL   RBC 4.24 3.87 - 5.11 MIL/uL   Hemoglobin 12.5 12.0 - 15.0 g/dL   HCT 63.8 75.6 - 43.3 %   MCV 85.1 80.0 - 100.0 fL   MCH 29.5 26.0 - 34.0 pg   MCHC 34.6 30.0 - 36.0 g/dL   RDW 29.5 18.8 - 41.6 %   Platelets 281 150 - 400 K/uL   nRBC 0.0 0.0 - 0.2 %  Type and screen     Status: None   Collection Time: 10/12/21  9:11 AM  Result Value Ref Range   ABO/RH(D) O NEG    Antibody Screen NEG    Sample Expiration      10/15/2021,2359 Performed at [facility], [address] [place], [place] [address]     Pertinent Results:  Prenatal Labs: Blood type/Rh O neg - Rhogam given 07/25/21   Antibody screen neg  Rubella Non-Immune  Varicella Non-Immune  RPR NR  HBsAg Neg  HIV NR  GC neg  Chlamydia neg  Genetic screening cfDNA negative, SMA neg, CF neg  1 hour GTT 90  3 hour GTT Neg  GBS POS   FHT:  FHR: 145 bpm, variability: moderate,  accelerations:  Present,  decelerations:  Absent Category/reactivity:  Category I UC:   Irregular, mild contractions   Cephalic by Leopolds and SVE  -Cephalic by [place] on 10/06/2021  No results found.  Assessment:  [name] is a 25 y.o. G2P0010 female at [redacted]w[redacted]d with scheduled IOL for obesity and macrosomia.   Plan:  1. Admit to Labor & Delivery; consents reviewed and obtained - Covid admission screen   2. Fetal Well being  - Fetal Tracing: 1 - Group B Streptococcus ppx indicated: GBS pos - Will start IP prophylaxis with early labor, ROM, or oxytocin - Presentation: cehpalic confirmed by SVE   3. Routine OB: - Prenatal labs reviewed, as above - Rh Neg - CBC, T&S, RPR on admit - Reg diet, saline lock  4. Induction of labor  - Contractions monitored with external toco - Pelvis adequate for trial of labor  - Plan for induction with misoprostol  - Induction with oxytocin, AROM, and cervical balloon as appropriate  - Plan for  continuous fetal monitoring - Maternal pain control as desired - Anticipate vaginal delivery  5. Post Partum Planning: - Infant feeding: Breast - Contraception: Undecided  - Tdap vaccine: given 07/25/2021 - Flu vaccine: given 08/08/2021  Gustavo Lah, CNM 10/12/21 10:36 AM  Margaretmary Eddy, CNM Certified Nurse Midwife [facility]

## 2021-10-12 NOTE — Anesthesia Procedure Notes (Signed)
Epidural Patient location during procedure: OB  Staffing Anesthesiologist: Piscitello, Cleda Mccreedy, MD Performed: anesthesiologist   Preanesthetic Checklist Completed: patient identified, IV checked, site marked, risks and benefits discussed, surgical consent, monitors and equipment checked, pre-op evaluation and timeout performed  Epidural Patient position: sitting Prep: ChloraPrep Patient monitoring: heart rate, continuous pulse ox and blood pressure Approach: midline Location: L5-S1 Injection technique: LOR saline  Needle:  Needle type: Tuohy  Needle gauge: 17 G Needle length: 9 cm and 9 Needle insertion depth: 9 cm Catheter type: closed end flexible Catheter size: 19 Gauge Catheter at skin depth: 15 cm Test dose: negative and 1.5% lidocaine with Epi 1:200 K  Assessment Sensory level: T10 Events: blood not aspirated, injection not painful, no injection resistance, no paresthesia and negative IV test  Additional Notes 1 st attempt Pt. Evaluated and documentation done after procedure finished. Patient identified. Risks/Benefits/Options discussed with patient including but not limited to bleeding, infection, nerve damage, paralysis, failed block, incomplete pain control, headache, blood pressure changes, nausea, vomiting, reactions to medication both or allergic, itching and postpartum back pain. Confirmed with bedside nurse the patient's most recent platelet count. Confirmed with patient that they are not currently taking any anticoagulation, have any bleeding history or any family history of bleeding disorders. Patient expressed understanding and wished to proceed. All questions were answered. Sterile technique was used throughout the entire procedure. Please see nursing notes for vital signs. Test dose was given through epidural catheter and negative prior to continuing to dose epidural or start infusion. Warning signs of high block given to the patient including shortness of  breath, tingling/numbness in hands, complete motor block, or any concerning symptoms with instructions to call for help. Patient was given instructions on fall risk and not to get out of bed. All questions and concerns addressed with instructions to call with any issues or inadequate analgesia.    Patient tolerated the insertion well without immediate complications.Reason for block:procedure for pain

## 2021-10-13 ENCOUNTER — Other Ambulatory Visit: Payer: Self-pay

## 2021-10-13 ENCOUNTER — Encounter: Payer: Self-pay | Admitting: Obstetrics and Gynecology

## 2021-10-13 ENCOUNTER — Encounter
Admission: EM | Disposition: A | Discharge status: Home / Self Care | Payer: Self-pay | Source: Home / Self Care | Attending: Obstetrics and Gynecology

## 2021-10-13 DIAGNOSIS — Z9889 Other specified postprocedural states: Secondary | ICD-10-CM

## 2021-10-13 DIAGNOSIS — Z3A39 39 weeks gestation of pregnancy: Secondary | ICD-10-CM | POA: Diagnosis not present

## 2021-10-13 DIAGNOSIS — O99824 Streptococcus B carrier state complicating childbirth: Secondary | ICD-10-CM | POA: Diagnosis not present

## 2021-10-13 LAB — CBC
HCT: 35.3 % — ABNORMAL LOW (ref 36.0–46.0)
Hemoglobin: 12.5 g/dL (ref 12.0–15.0)
MCH: 30.1 pg (ref 26.0–34.0)
MCHC: 35.4 g/dL (ref 30.0–36.0)
MCV: 85.1 fL (ref 80.0–100.0)
Platelets: 227 10*3/uL (ref 150–400)
RBC: 4.15 MIL/uL (ref 3.87–5.11)
RDW: 14.5 % (ref 11.5–15.5)
WBC: 17.9 10*3/uL — ABNORMAL HIGH (ref 4.0–10.5)
nRBC: 0 % (ref 0.0–0.2)

## 2021-10-13 LAB — CREATININE, SERUM
Creatinine, Ser: 0.68 mg/dL (ref 0.44–1.00)
GFR, Estimated: >60 mL/min (ref >60)

## 2021-10-13 LAB — RPR: RPR Ser Ql: NONREACTIVE

## 2021-10-13 SURGERY — Surgical Case
Anesthesia: Epidural

## 2021-10-13 MED ORDER — MIDAZOLAM HCL 2 MG/2ML IJ SOLN
INTRAMUSCULAR | Status: DC | PRN
  Administered 2021-10-13: 2 mg via INTRAVENOUS

## 2021-10-13 MED ORDER — MORPHINE SULFATE (PF) 0.5 MG/ML IJ SOLN
INTRAMUSCULAR | Status: DC | PRN
  Administered 2021-10-13: 3 mg via EPIDURAL

## 2021-10-13 MED ORDER — LIDOCAINE HCL (PF) 2 % IJ SOLN
INTRAMUSCULAR | Status: DC | PRN
  Administered 2021-10-13 (×2): 60 mg via EPIDURAL
  Administered 2021-10-13: 100 mg via EPIDURAL
  Administered 2021-10-13: 80 mg via EPIDURAL
  Administered 2021-10-13: 100 mg via EPIDURAL

## 2021-10-13 MED ORDER — PHENYLEPHRINE HCL (PRESSORS) 10 MG/ML IV SOLN
INTRAVENOUS | Status: AC
  Filled 2021-10-13: qty 1

## 2021-10-13 MED ORDER — NALOXONE HCL 0.4 MG/ML IJ SOLN: 0.4000 mg | INTRAMUSCULAR | Status: DC | PRN

## 2021-10-13 MED ORDER — CEFAZOLIN IN SODIUM CHLORIDE 3-0.9 GM/100ML-% IV SOLN
3.0000 g | Freq: Once | INTRAVENOUS | Status: AC
  Administered 2021-10-13: 12:00:00 3 g via INTRAVENOUS
  Filled 2021-10-13: qty 100

## 2021-10-13 MED ORDER — DIBUCAINE (PERIANAL) 1 % EX OINT: 1.0000 "application " | TOPICAL_OINTMENT | CUTANEOUS | Status: DC | PRN

## 2021-10-13 MED ORDER — MORPHINE SULFATE (PF) 0.5 MG/ML IJ SOLN
INTRAMUSCULAR | Status: AC
  Filled 2021-10-13: qty 10

## 2021-10-13 MED ORDER — LIDOCAINE HCL 2 % IJ SOLN
INTRAMUSCULAR | Status: AC
  Filled 2021-10-13: qty 20

## 2021-10-13 MED ORDER — ONDANSETRON HCL 4 MG/2ML IJ SOLN: 4.0000 mg | Freq: Three times a day (TID) | INTRAMUSCULAR | Status: DC | PRN

## 2021-10-13 MED ORDER — 0.9 % SODIUM CHLORIDE (POUR BTL) OPTIME
TOPICAL | Status: DC | PRN
  Administered 2021-10-13: 13:00:00 40 mL

## 2021-10-13 MED ORDER — ACETAMINOPHEN 500 MG PO TABS
1000.0000 mg | ORAL_TABLET | Freq: Four times a day (QID) | ORAL | Status: DC
  Administered 2021-10-13: 21:00:00 1000 mg via ORAL
  Filled 2021-10-13: qty 2

## 2021-10-13 MED ORDER — OXYCODONE HCL 5 MG PO TABS: 5.0000 mg | ORAL_TABLET | ORAL | Status: DC | PRN

## 2021-10-13 MED ORDER — SODIUM CHLORIDE (PF) 0.9 % IJ SOLN
INTRAMUSCULAR | Status: AC
  Filled 2021-10-13: qty 50

## 2021-10-13 MED ORDER — TETANUS-DIPHTH-ACELL PERTUSSIS 5-2.5-18.5 LF-MCG/0.5 IM SUSY
0.5000 mL | PREFILLED_SYRINGE | Freq: Once | INTRAMUSCULAR | Status: DC
  Filled 2021-10-13 (×2): qty 0.5

## 2021-10-13 MED ORDER — OXYCODONE HCL 5 MG PO TABS: 10.0000 mg | ORAL_TABLET | ORAL | Status: DC | PRN

## 2021-10-13 MED ORDER — FENTANYL CITRATE (PF) 100 MCG/2ML IJ SOLN
INTRAMUSCULAR | Status: DC | PRN
  Administered 2021-10-13: 100 ug via EPIDURAL

## 2021-10-13 MED ORDER — OXYTOCIN-SODIUM CHLORIDE 30-0.9 UT/500ML-% IV SOLN
INTRAVENOUS | Status: DC | PRN
  Administered 2021-10-13: 300 mL via INTRAVENOUS

## 2021-10-13 MED ORDER — ZOLPIDEM TARTRATE 5 MG PO TABS: 5.0000 mg | ORAL_TABLET | Freq: Every evening | ORAL | Status: DC | PRN

## 2021-10-13 MED ORDER — DIPHENHYDRAMINE HCL 25 MG PO CAPS: 25.0000 mg | ORAL_CAPSULE | ORAL | Status: DC | PRN

## 2021-10-13 MED ORDER — MEASLES, MUMPS & RUBELLA VAC IJ SOLR
0.5000 mL | Freq: Once | INTRAMUSCULAR | Status: DC
  Filled 2021-10-13: qty 0.5

## 2021-10-13 MED ORDER — ONDANSETRON HCL 4 MG/2ML IJ SOLN
INTRAMUSCULAR | Status: DC | PRN
  Administered 2021-10-13: 4 mg via INTRAVENOUS

## 2021-10-13 MED ORDER — KETOROLAC TROMETHAMINE 30 MG/ML IJ SOLN
30.0000 mg | Freq: Four times a day (QID) | INTRAMUSCULAR | Status: AC
  Administered 2021-10-13 – 2021-10-14 (×4): 30 mg via INTRAVENOUS
  Filled 2021-10-13 (×4): qty 1

## 2021-10-13 MED ORDER — METHYLERGONOVINE MALEATE 0.2 MG/ML IJ SOLN
INTRAMUSCULAR | Status: AC
  Filled 2021-10-13: qty 1

## 2021-10-13 MED ORDER — CARBOPROST TROMETHAMINE 250 MCG/ML IM SOLN
INTRAMUSCULAR | Status: AC
  Filled 2021-10-13: qty 1

## 2021-10-13 MED ORDER — MEPERIDINE HCL 25 MG/ML IJ SOLN: 6.2500 mg | INTRAMUSCULAR | Status: DC | PRN

## 2021-10-13 MED ORDER — MORPHINE SULFATE (PF) 2 MG/ML IV SOLN: 1.0000 mg | INTRAVENOUS | Status: DC | PRN

## 2021-10-13 MED ORDER — ACETAMINOPHEN 500 MG PO TABS: 1000.0000 mg | ORAL_TABLET | Freq: Four times a day (QID) | ORAL | Status: DC

## 2021-10-13 MED ORDER — COCONUT OIL OIL: 1.0000 "application " | TOPICAL_OIL | Status: DC | PRN

## 2021-10-13 MED ORDER — KETOROLAC TROMETHAMINE 30 MG/ML IJ SOLN: 30.0000 mg | Freq: Four times a day (QID) | INTRAMUSCULAR | Status: DC

## 2021-10-13 MED ORDER — WITCH HAZEL-GLYCERIN EX PADS: 1.0000 "application " | MEDICATED_PAD | CUTANEOUS | Status: DC | PRN

## 2021-10-13 MED ORDER — BUPIVACAINE HCL (PF) 0.5 % IJ SOLN
INTRAMUSCULAR | Status: AC
  Filled 2021-10-13: qty 60

## 2021-10-13 MED ORDER — PHENYLEPHRINE HCL (PRESSORS) 10 MG/ML IV SOLN
INTRAVENOUS | Status: DC | PRN
  Administered 2021-10-13 (×5): 80 ug via INTRAVENOUS

## 2021-10-13 MED ORDER — VARICELLA VIRUS VACCINE LIVE 1350 PFU/0.5ML IJ SUSR
0.5000 mL | Freq: Once | INTRAMUSCULAR | Status: AC
  Administered 2021-10-15: 0.5 mL via SUBCUTANEOUS
  Filled 2021-10-13 (×2): qty 0.5

## 2021-10-13 MED ORDER — NALOXONE HCL 4 MG/10ML IJ SOLN
1.0000 ug/kg/h | INTRAVENOUS | Status: DC | PRN
  Filled 2021-10-13: qty 5

## 2021-10-13 MED ORDER — ACETAMINOPHEN 500 MG PO TABS
1000.0000 mg | ORAL_TABLET | Freq: Four times a day (QID) | ORAL | Status: DC
  Administered 2021-10-13: 14:00:00 1000 mg via ORAL
  Filled 2021-10-13: qty 2

## 2021-10-13 MED ORDER — MIDAZOLAM HCL 2 MG/2ML IJ SOLN
INTRAMUSCULAR | Status: AC
  Filled 2021-10-13: qty 2

## 2021-10-13 MED ORDER — KETOROLAC TROMETHAMINE 30 MG/ML IJ SOLN
INTRAMUSCULAR | Status: DC | PRN
  Administered 2021-10-13: 30 mg via INTRAVENOUS

## 2021-10-13 MED ORDER — DIPHENHYDRAMINE HCL 50 MG/ML IJ SOLN: 12.5000 mg | INTRAMUSCULAR | Status: DC | PRN

## 2021-10-13 MED ORDER — DEXMEDETOMIDINE (PRECEDEX) IN NS 20 MCG/5ML (4 MCG/ML) IV SYRINGE
PREFILLED_SYRINGE | INTRAVENOUS | Status: DC | PRN
  Administered 2021-10-13 (×3): 8 ug via INTRAVENOUS

## 2021-10-13 MED ORDER — SCOPOLAMINE 1 MG/3DAYS TD PT72
1.0000 | MEDICATED_PATCH | Freq: Once | TRANSDERMAL | Status: DC
  Administered 2021-10-13: 14:00:00 1.5 mg via TRANSDERMAL
  Filled 2021-10-13: qty 1

## 2021-10-13 MED ORDER — BUPIVACAINE HCL (PF) 0.5 % IJ SOLN
INTRAMUSCULAR | Status: DC | PRN
  Administered 2021-10-13: 60 mg

## 2021-10-13 MED ORDER — MENTHOL 3 MG MT LOZG
1.0000 | LOZENGE | OROMUCOSAL | Status: DC | PRN
  Filled 2021-10-13: qty 9

## 2021-10-13 MED ORDER — SOD CITRATE-CITRIC ACID 500-334 MG/5ML PO SOLN
ORAL | Status: AC
  Filled 2021-10-13: qty 15

## 2021-10-13 MED ORDER — SODIUM CHLORIDE 0.9 % IV SOLN
500.0000 mg | Freq: Once | INTRAVENOUS | Status: AC
  Administered 2021-10-13: 12:00:00 500 mg via INTRAVENOUS
  Filled 2021-10-13: qty 5

## 2021-10-13 MED ORDER — METHYLERGONOVINE MALEATE 0.2 MG/ML IJ SOLN
INTRAMUSCULAR | Status: DC | PRN
  Administered 2021-10-13: .2 mg via INTRAMUSCULAR

## 2021-10-13 MED ORDER — DIPHENHYDRAMINE HCL 25 MG PO CAPS: 25.0000 mg | ORAL_CAPSULE | Freq: Four times a day (QID) | ORAL | Status: DC | PRN

## 2021-10-13 MED ORDER — OXYTOCIN-SODIUM CHLORIDE 30-0.9 UT/500ML-% IV SOLN
2.5000 [IU]/h | INTRAVENOUS | Status: AC
  Administered 2021-10-13: 13:00:00 2.5 [IU]/h via INTRAVENOUS

## 2021-10-13 MED ORDER — SENNOSIDES-DOCUSATE SODIUM 8.6-50 MG PO TABS
2.0000 | ORAL_TABLET | Freq: Every day | ORAL | Status: DC
  Administered 2021-10-14 – 2021-10-15 (×2): 2 via ORAL
  Filled 2021-10-13 (×2): qty 2

## 2021-10-13 MED ORDER — SIMETHICONE 80 MG PO CHEW: 80.0000 mg | CHEWABLE_TABLET | ORAL | Status: DC | PRN

## 2021-10-13 MED ORDER — FENTANYL CITRATE (PF) 100 MCG/2ML IJ SOLN
INTRAMUSCULAR | Status: AC
  Filled 2021-10-13: qty 2

## 2021-10-13 MED ORDER — ENOXAPARIN SODIUM 80 MG/0.8ML IJ SOSY
0.5000 mg/kg | PREFILLED_SYRINGE | INTRAMUSCULAR | Status: DC
  Administered 2021-10-14: 22:00:00 75 mg via SUBCUTANEOUS
  Filled 2021-10-13 (×2): qty 0.75

## 2021-10-13 MED ORDER — SODIUM CHLORIDE 0.9% FLUSH: 3.0000 mL | INTRAVENOUS | Status: DC | PRN

## 2021-10-13 MED ORDER — PRENATAL MULTIVITAMIN CH
1.0000 | ORAL_TABLET | Freq: Every day | ORAL | Status: DC
  Administered 2021-10-14 – 2021-10-15 (×2): 1 via ORAL
  Filled 2021-10-13 (×2): qty 1

## 2021-10-13 MED ORDER — IBUPROFEN 600 MG PO TABS
600.0000 mg | ORAL_TABLET | Freq: Four times a day (QID) | ORAL | Status: DC
  Administered 2021-10-14 – 2021-10-15 (×4): 600 mg via ORAL
  Filled 2021-10-13 (×4): qty 1

## 2021-10-13 MED ORDER — SIMETHICONE 80 MG PO CHEW
80.0000 mg | CHEWABLE_TABLET | Freq: Three times a day (TID) | ORAL | Status: DC
  Administered 2021-10-13 – 2021-10-15 (×6): 80 mg via ORAL
  Filled 2021-10-13 (×6): qty 1

## 2021-10-13 SURGICAL SUPPLY — 34 items
BARRIER ADHS 3X4 INTERCEED (GAUZE/BANDAGES/DRESSINGS) ×3 IMPLANT
CHLORAPREP W/TINT 26 (MISCELLANEOUS) ×3 IMPLANT
DRSG TELFA 3X8 NADH (GAUZE/BANDAGES/DRESSINGS) ×3 IMPLANT
ELECT CAUTERY BLADE 6.4 (BLADE) ×3 IMPLANT
ELECT REM PT RETURN 9FT ADLT (ELECTROSURGICAL) ×3
ELECTRODE REM PT RTRN 9FT ADLT (ELECTROSURGICAL) ×1 IMPLANT
GAUZE SPONGE 4X4 12PLY STRL (GAUZE/BANDAGES/DRESSINGS) ×3 IMPLANT
GLOVE SURG SYN 8.0 (GLOVE) ×3 IMPLANT
GLOVE SURG SYN 8.0 PF PI (GLOVE) ×1 IMPLANT
GOWN STRL REUS W/ TWL LRG LVL3 (GOWN DISPOSABLE) ×2 IMPLANT
GOWN STRL REUS W/ TWL XL LVL3 (GOWN DISPOSABLE) ×1 IMPLANT
GOWN STRL REUS W/TWL LRG LVL3 (GOWN DISPOSABLE) ×4
GOWN STRL REUS W/TWL XL LVL3 (GOWN DISPOSABLE) ×2
MANIFOLD NEPTUNE II (INSTRUMENTS) ×3 IMPLANT
MAT PREVALON FULL STRYKER (MISCELLANEOUS) ×3 IMPLANT
NEEDLE HYPO 22GX1.5 SAFETY (NEEDLE) ×3 IMPLANT
NS IRRIG 1000ML POUR BTL (IV SOLUTION) ×3 IMPLANT
PACK C SECTION AR (MISCELLANEOUS) ×3 IMPLANT
PAD ABD DERMACEA PRESS 5X9 (GAUZE/BANDAGES/DRESSINGS) ×2 IMPLANT
PAD DRESSING TELFA 3X8 NADH (GAUZE/BANDAGES/DRESSINGS) ×1 IMPLANT
PAD OB MATERNITY 4.3X12.25 (PERSONAL CARE ITEMS) ×3 IMPLANT
PAD PREP 24X41 OB/GYN DISP (PERSONAL CARE ITEMS) ×3 IMPLANT
SCRUB EXIDINE 4% CHG 4OZ (MISCELLANEOUS) ×3 IMPLANT
STAPLER INSORB 30 2030 C-SECTI (MISCELLANEOUS) ×2 IMPLANT
STRAP SAFETY 5IN WIDE (MISCELLANEOUS) ×3 IMPLANT
SUCT VACUUM KIWI BELL (SUCTIONS) ×2 IMPLANT
SUT CHROMIC 1 CTX 36 (SUTURE) ×9 IMPLANT
SUT PLAIN GUT 0 (SUTURE) ×6 IMPLANT
SUT VIC AB 0 CT1 36 (SUTURE) ×8 IMPLANT
SYR 30ML LL (SYRINGE) ×6 IMPLANT
TAPE PAPER 3X10 WHT MICROPORE (GAUZE/BANDAGES/DRESSINGS) ×2 IMPLANT
TUBE CONNECTING  6'X3/16 (MISCELLANEOUS) ×1
TUBE CONNECTING 6X3/16 (MISCELLANEOUS) ×1 IMPLANT
WATER STERILE IRR 500ML POUR (IV SOLUTION) ×3 IMPLANT

## 2021-10-13 NOTE — Progress Notes (Addendum)
Labor Progress Note  [name] is a 25 y.o. G2P0010 at [redacted]w[redacted]d by ultrasound admitted for induction of labor due to obesity and macrosomia.  Subjective: feeling comfortable with epidural, able to rest   Objective: BP 122/80    Pulse (!) 108    Temp 98.2 F (36.8 C) (Oral)    Resp 18    Ht 5\' 5"  (1.651 m)    Wt (!) 151.5 kg Comment: 334lbs   LMP 01/05/2021 Comment: EDD based off LMP   SpO2 98%    BMI 55.58 kg/m  Notable VS details: reviewed   Fetal Assessment: FHT:  FHR: 140 bpm, variability: minimal ,  accelerations:  Abscent,  decelerations:  Absent Category/reactivity:  Category II UC:   regular, every 2-3 minutes, MVU's 250 SVE:   5-6/90/-2 Membrane status: AROM at 0010 Amniotic color: Clear   Labs: Lab Results  Component Value Date   WBC 10.9 (H) 10/12/2021   HGB 12.5 10/12/2021   HCT 36.1 10/12/2021   MCV 85.1 10/12/2021   PLT 281 10/12/2021    Assessment / Plan: IOL d/t macrosomia and obesity -Oxytocin infusing at 2 millinunits/min, MVU's > 200 -Cervical change from previous exam 3 hours ago  -Dr. 10/14/2021 updated on fetal tracing and exam, requested to review  Labor:  Cervical change from 4cm to 5-6cm over past 3 hours, adequate MVU's on 2 milliunits/min oxytocin  Fetal Wellbeing:  Category II - for minimal variability  -overall reassuring with no decelerations with contractions  Pain Control:  Epidural I/D:   GBS pos, PCN x 3 doses, afebrile, ROM x 6 hours  Anticipated MOD:  NSVD - possible cesarean birth if tracing becomes more concerning, protracted labor, or arrest of descent   [name], [name] 10/13/2021, 6:44 AM

## 2021-10-13 NOTE — Lactation Note (Signed)
This note was copied from a baby's chart. Lactation Consultation Note  Patient Name: [name] [id number] Date: 10/13/2021 Reason for consult: L&D Initial assessment;Primapara;1st time breastfeeding;Term;Other (Comment) (c-section) Age:27 hours  Initial lactation visit in LDR2. Mom had c-section for arrest of decent. Baby is at breast in football by transition RN. Attempts made to latch- RN and LC note inverted nipples. Size 64mm applied, baby grasped breast easily and quickly, flanged top/bottom lips and rhythmic sucking pattern.  LC did readjust baby's position for football hold to be more breast level and turned on side to prevent weight of breast on baby's chest. Baby did well with feeding with LC at bedside. LC positioned mom's hands/arm for head and neck support. Tips given for keeping baby awake/alert for as long as possible for first feeding. Mom voiced understanding. LC to follow-up on MBU.  Maternal Data Has patient been taught Hand Expression?: Yes Does the patient have breastfeeding experience prior to this delivery?: No  Feeding Mother's Current Feeding Choice: Breast Milk  LATCH Score Latch: Grasps breast easily, tongue down, lips flanged, rhythmical sucking. (w/ nipple shield)  Audible Swallowing: A few with stimulation  Type of Nipple: Inverted  Comfort (Breast/Nipple): Soft / non-tender  Hold (Positioning): Assistance needed to correctly position infant at breast and maintain latch.  LATCH Score: 6   Lactation Tools Discussed/Used Tools: Nipple Shields Nipple shield size: 24 (inverted nipples)  Interventions Interventions: Breast feeding basics reviewed;Assisted with latch;Hand express;Adjust position;Support pillows;Education (nipple shield)  Discharge    Consult Status Consult Status: Follow-up from L&D    Danford Bad 10/13/2021, 1:40 PM

## 2021-10-13 NOTE — Progress Notes (Signed)
Labor Progress Note  [name] is a 25 y.o. G2P0010 at [redacted]w[redacted]d by ultrasound admitted for induction of labor due to obesity and macrosomia.  Subjective: Pt is resting well  Objective: BP 121/79    Pulse (!) 104    Temp 98.7 F (37.1 C) (Oral)    Resp 18    Ht 5\' 5"  (1.651 m)    Wt (!) 151.5 kg Comment: 334lbs   LMP 01/05/2021 Comment: EDD based off LMP   SpO2 99%    BMI 55.58 kg/m   Fetal Assessment: FHT:  FHR: 150 bpm, variability: minimal ,  accelerations:  Present - 10x10s,  decelerations:  Present lates x2 recently - resolved with position changes Category/reactivity:  Category II UC:   regular, every 2-4 minutes, MVU's 145-150 SVE:   5/80-90/-2 Membrane status: AROM at 0010 Amniotic color: Clear   Assessment / Plan: IOL d/t macrosomia and obesity - Pitocin stopped at 0702 for FHT - Dr. 0703 updated by Feliberto Gottron CNM - At 0900 Dr. Margaretmary Eddy was again updated about Cat II tracing - he wanted another SVE - At 0928 SVE was 5.5/90/-2 and Dr. 07-01-1990 updated.  Labor:  Pitocin off since 0700 d/t FHTs with continued min variability and only 10x10 accelerations. MVUs have been progressively decreasing.  Fetal Wellbeing:  Category II - for minimal variability  -2 shallow late decels noted with resolution with position changes Pain Control:  Epidural I/D:   GBS pos, PCN x 4 doses, afebrile, ROM x 11 hours  Anticipated MOD: Dr. Feliberto Gottron plans to see pt to discuss labor progress  Feliberto Gottron, CNM 10/13/2021, 10:55 AM

## 2021-10-13 NOTE — Progress Notes (Signed)
PHARMACIST - PHYSICIAN COMMUNICATION  CONCERNING:  Enoxaparin (Lovenox) for DVT Prophylaxis    RECOMMENDATION: Patient was prescribed enoxaparin 40mg  q24 hours for VTE prophylaxis.   Filed Weights   10/12/21 0829  Weight: (!) 151.5 kg (334 lb)    Body mass index is 55.58 kg/m.  Estimated Creatinine Clearance: 158.1 mL/min (by C-G formula based on SCr of 0.68 mg/dL).   Based on [facility] policy patient is candidate for enoxaparin 0.5mg /kg TBW SQ every 24 hours based on BMI being >30.  DESCRIPTION: Pharmacy has adjusted enoxaparin dose per [facility] policy.  Patient is now receiving enoxaparin 75 mg every 24 hours   [facility] 10/13/2021 3:51 PM

## 2021-10-13 NOTE — Transfer of Care (Signed)
Immediate Anesthesia Transfer of Care Note  Patient: [name]  Procedure(s) Performed: CESAREAN SECTION  Patient Location: LDR2  Anesthesia Type:Epidural  Level of Consciousness: awake, alert  and oriented  Airway & Oxygen Therapy: Patient Spontanous Breathing  Post-op Assessment: Report given to RN and Post -op Vital signs reviewed and stable  Post vital signs: Reviewed and stable  Last Vitals:  Vitals Value Taken Time  BP 105/70   Temp    Pulse 101   Resp 22   SpO2 100     Last Pain:  Vitals:   10/13/21 1111  TempSrc: Oral  PainSc:          Complications: No notable events documented.

## 2021-10-13 NOTE — Brief Op Note (Signed)
10/13/2021  12:54 PM  PATIENT:  [name]  25 y.o. female  PRE-OPERATIVE DIAGNOSIS: Active phase arrest  POST-OPERATIVE DIAGNOSIS:  active phase arrest PROCEDURE:  Procedure(s): CESAREAN SECTION LTCS , vacuum assisted  SURGEON:  Surgeon(s) and Role:    * Raenette Sakata, Ihor Austin, MD - Primary  PHYSICIAN ASSISTANT: Haroldine Laws  ASSISTANTS: none   ANESTHESIA:   epidural  EBL:  qbl : 685cc  BLOOD ADMINISTERED:none  DRAINS: Urinary Catheter (Foley)   LOCAL MEDICATIONS USED:  MARCAINE     SPECIMEN:  No Specimen  DISPOSITION OF SPECIMEN:  N/A  COUNTS:  YES  TOURNIQUET:  * No tourniquets in log *  DICTATION: .Other Dictation: Dictation Number verbal  PLAN OF CARE: Admit to inpatient   PATIENT DISPOSITION:  PACU - hemodynamically stable.   Delay start of Pharmacological VTE agent (>24hrs) due to surgical blood loss or risk of bleeding: not applicable

## 2021-10-13 NOTE — Progress Notes (Signed)
Pt has not may cervical change now for 5 hours . My exam 4-5 cm / 80% + edema /-2  Failure to progress, Active phase arrest . I recommend primary LTCS   Start Azithromycin 500 mg + ancef 3 gm  I have counseled the pt . All questions answered . Proceed

## 2021-10-13 NOTE — Discharge Summary (Signed)
Obstetrical Discharge Summary  Patient Name: [name] DOB: [birth year] MRN: [id number]  Date of Admission: 10/12/2021 Date of Delivery: 10/13/21 Delivered by: Dr. Ouida Sills Date of Discharge: 10/15/2021  Primary OB: [place]  [id number] last menstrual period was 01/05/2021. 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  Varicella and rubella non-immune  Admitting Diagnosis: IOL for obesity and macrosomia  Secondary Diagnosis: Patient Active Problem List   Diagnosis Date Noted   Postoperative state 10/13/2021   Encounter for planned induction of labor 10/12/2021   Uterine size date discrepancy, third trimester 10/12/2021   Morbid obesity with BMI of 50.0-59.9, adult ([place]) 10/12/2021   Obesity affecting pregnancy in third trimester 10/06/2021   Macrosomia 10/06/2021   Supervision of other high risk pregnancies, third trimester 03/04/2021    Augmentation: AROM, Pitocin, and Cytotec Complications: None  Intrapartum complications/course:  Delivery Type: primary cesarean section, low transverse incision, vacuum assisted Anesthesia: epidural Placenta: spontaneous Laceration: n/a Episiotomy: none Newborn Data: Live born female  Birth Weight: 8 lb 12.4 oz (3980 g) APGAR: 9, 9  Newborn Delivery   Birth date/time: 10/13/2021 12:13:00 Delivery type: C-Section, Vacuum Assisted Trial of labor: No C-section categorization: Primary      25 y.o. G2P1011 at [redacted]w[redacted]d presenting for an IOL.  She completed 2 doses of cytotec at 1032 and 1434, and her cervix changed from 1cm to 2cm.  This was followed by the initiation of pitocin at [place] and FSE and IUPC (AROM with clear fluid) were placed around 0030. MVUs became >200 around 0100 and remained there  until about 0630.  During this time she changed her cervix from 3cm to 5cm.  However, around 0330 the [facility] showed minimal variability and frequent UCs that improved with position changes and turning the pitocin off.  At 0702 the pitocin was turned off a final time, again d/t minimal variability.  At this time MVUs quickly decreased and it was determined to proceed with an cesarean delivery.  Mom and baby tolerated the procedure well.   Postpartum Procedures: none  Post partum course:   Patient had an uncomplicated postpartum course.  By time of discharge on POD#2, her pain was controlled on oral pain medications; she had appropriate lochia and was ambulating, voiding without difficulty, tolerating regular diet and passing flatus.   She was deemed stable for discharge to home.    Discharge Physical Exam:  BP 116/82 (BP Location: Right Arm)    Pulse 96    Temp 97.6 F (36.4 C) (Oral)    Resp 17    Ht 5\' 5"  (1.651 m)    Wt (!) 151.5 kg Comment: 334lbs   LMP 01/05/2021 Comment: EDD based off LMP   SpO2 99%    Breastfeeding Unknown    BMI 55.58 kg/m   General: alert and no distress Pulm: normal respiratory effort Lochia: appropriate Abdomen: soft, NT Uterine Fundus: firm, below umbilicus Incision: c/d/i, healing well, no significant drainage, no dehiscence, no significant erythema; honeycomb dsg intact.  Extremities: No evidence of DVT seen on physical exam. No lower extremity edema.  Edinburgh:  Edinburgh Postnatal Depression Scale Screening Tool 10/13/2021 10/13/2021  I have been able to laugh and see the funny side of things. 0 (No Data)  I have looked forward with enjoyment to things. 0 -  I  have blamed myself unnecessarily when things went wrong. 0 -  I have been anxious or worried for no good reason. 0 -  I have felt scared or panicky for no good reason. 0 -  Things have been getting on top of me. 0 -  I have been so unhappy that I have had difficulty sleeping. 0 -  I have felt sad or  miserable. 0 -  I have been so unhappy that I have been crying. 0 -  The thought of harming myself has occurred to me. 0 -  Edinburgh Postnatal Depression Scale Total 0 -     Labs: CBC Latest Ref Rng & Units 10/14/2021 10/13/2021 10/12/2021  WBC 4.0 - 10.5 K/uL 14.7(H) 17.9(H) 10.9(H)  Hemoglobin 12.0 - 15.0 g/dL 10.5(L) 12.5 12.5  Hematocrit 36.0 - 46.0 % 30.4(L) 35.3(L) 36.1  Platelets 150 - 400 K/uL 190 227 281   O NEG Hemoglobin  Date Value Ref Range Status  10/14/2021 10.5 (L) 12.0 - 15.0 g/dL Final   HGB  Date Value Ref Range Status  12/13/2013 11.5 (L) 12.0 - 16.0 g/dL Final   HCT  Date Value Ref Range Status  10/14/2021 30.4 (L) 36.0 - 46.0 % Final  12/13/2013 33.5 (L) 35.0 - 47.0 % Final    Disposition: stable, discharge to home Baby Feeding: breastmilk Baby Disposition: home with mom  Contraception: TBD  Prenatal Labs:  Blood type/Rh O neg - Rhogam given 07/25/21  Antibody screen neg  Rubella Non-Immune  Varicella Non-Immune  RPR NR  HBsAg Neg  HIV NR  GC neg  Chlamydia neg  Genetic screening cfDNA negative, SMA neg, CF neg  1 hour GTT 90  3 hour GTT Neg  GBS POS   Rh Immune globulin given: Rh neg, given Rhogam  Rubella vaccine given: Needs vaccine, order placed at delivery Varicella vaccine given: Needs vaccine, order placed at delivery Tdap vaccine given in AP or PP setting: given 07/25/2021 Flu vaccine given in AP or PP setting: given 08/08/2021  Plan: [name] was discharged to home in good condition.  Discharge Instructions: Per After Visit Summary. Activity: Advance as tolerated. Pelvic rest for 6 weeks.   Diet: Regular Discharge Medications: Allergies as of 10/15/2021   No Known Allergies      Medication List     STOP taking these medications    aspirin 81 MG chewable tablet   ondansetron 4 MG tablet Commonly known as: ZOFRAN       TAKE these medications    acetaminophen 500 MG tablet Commonly known as:  TYLENOL Take 2 tablets (1,000 mg total) by mouth every 6 (six) hours. What changed:  medication strength how much to take when to take this reasons to take this Notes to patient: Can take at 1545   coconut oil Oil Apply 1 application topically as needed.   diphenhydrAMINE 25 mg capsule Commonly known as: BENADRYL Take 1 capsule (25 mg total) by mouth every 6 (six) hours as needed for itching.   ibuprofen 600 MG tablet Commonly known as: ADVIL Take 1 tablet (600 mg total) by mouth every 6 (six) hours. Notes to patient: Can take at 8pm   oxyCODONE 5 MG immediate release tablet Commonly known as: Oxy IR/ROXICODONE Take 1 tablet (5 mg total) by mouth every 6 (six) hours as needed for up to 7 days for moderate pain or severe pain. Notes to patient: Can take at 8 pm   prenatal multivitamin Tabs tablet Take 1 tablet by  mouth daily at 12 noon.   senna-docusate 8.6-50 MG tablet Commonly known as: Senokot-S Take 2 tablets by mouth daily. Start taking on: October 16, 2021   simethicone 80 MG chewable tablet Commonly known as: MYLICON Chew 1 tablet (80 mg total) by mouth as needed for flatulence.   witch hazel-glycerin pad Commonly known as: TUCKS Apply 1 application topically as needed for hemorrhoids.       Outpatient follow up:   Follow-up Information     Schermerhorn, Ihor Austin, MD Follow up in 2 week(s).   Specialty: Obstetrics and Gynecology Contact information: [address] [place] [address] [phone]         [facility] Follow up in 2 week(s).   Why: at 10am   Call sooner for any concerns Contact information: [address] [place] [address] [phone]                Signed: Randa Ngo, CNM  10/15/2021 5:58 PM

## 2021-10-13 NOTE — Progress Notes (Signed)
Labor Progress Note  [name] is a 25 y.o. G2P0010 at [redacted]w[redacted]d by ultrasound admitted for induction of labor due to macrosomia and obesity.  Subjective: feeling comfortable with epidural, resting   Objective: BP 134/78    Pulse (!) 104    Temp 97.6 F (36.4 C) (Oral)    Resp 18    Ht 5\' 5"  (1.651 m)    Wt (!) 151.5 kg Comment: 334lbs   LMP 01/05/2021 Comment: EDD based off LMP   SpO2 99%    BMI 55.58 kg/m  Notable VS details: reviewed   Fetal Assessment: FHT:  FHR: 140 bpm, variability: minimal ,  accelerations:  Abscent,  decelerations:  Absent Category/reactivity:  Category II UC:   irregular, every 2-3 minutes, MVU's 250 SVE:   4/90/-2 by RN Membrane status: AROM at 0010 Amniotic color: Clear  Labs: Lab Results  Component Value Date   WBC 10.9 (H) 10/12/2021   HGB 12.5 10/12/2021   HCT 36.1 10/12/2021   MCV 85.1 10/12/2021   PLT 281 10/12/2021    Assessment / Plan: IOL d/t macrosomia and obesity -MVU's adequate over past 2 hours  -Oxytocin discontinued d/t frequent contractions and decreased variability - Moderate->minimal->absent->minimal -IV fluid bolus given  -PO orange juice given for glucose  -Position changed  -Minimal cervical change over past 2 hours -contractions are adequate despite oxytocin being turned off -Will leave oxytocin off for now and continue to monitor closely   Labor:  early labor Fetal Wellbeing:  Category II - overall reassuring, monitoring closely, will use supportive position changes  Pain Control:  Epidural I/D:   GBS pos, 2 doses of PCN given Anticipated MOD:  NSVD  10/14/2021, CNM 10/13/2021, 3:18 AM

## 2021-10-13 NOTE — Progress Notes (Signed)
Labor Progress Note  [name] is a 25 y.o. G2P0010 at [redacted]w[redacted]d by ultrasound admitted for induction of labor due to obesity and macrosomia.  Subjective: feeling better after epidural  Objective: BP (!) 97/52    Pulse (!) 101    Temp 98.7 F (37.1 C) (Axillary)    Resp 18    Ht 5\' 5"  (1.651 m)    Wt (!) 151.5 kg Comment: 334lbs   LMP 01/05/2021 Comment: EDD based off LMP   SpO2 99%    BMI 55.58 kg/m  Notable VS details: reviewed   Fetal Assessment: FHT:  FHR: 135 bpm, variability: moderate,  accelerations:  Present,  decelerations:  Absent Category/reactivity:  Category I UC:   regular, every 2-3 minutes SVE:   3-4/90/-2, FSE and IUPC placed  Membrane status: AROM at 0010 Amniotic color: Clear   Labs: Lab Results  Component Value Date   WBC 10.9 (H) 10/12/2021   HGB 12.5 10/12/2021   HCT 36.1 10/12/2021   MCV 85.1 10/12/2021   PLT 281 10/12/2021    Assessment / Plan: Induction of labor due to obesity and macrosomia,  progressing well on pitocin -IUPC and FSE placed   Labor: Progressing normally Fetal Wellbeing:  Category I Pain Control:  Epidural I/D:   GBS positive, 2nd dose of PCN given Anticipated MOD:  NSVD  10/14/2021, CNM 10/13/2021, 12:31 AM

## 2021-10-14 ENCOUNTER — Encounter: Payer: Self-pay | Admitting: Obstetrics and Gynecology

## 2021-10-14 DIAGNOSIS — O9081 Anemia of the puerperium: Secondary | ICD-10-CM | POA: Diagnosis not present

## 2021-10-14 LAB — CBC
HCT: 30.4 % — ABNORMAL LOW (ref 36.0–46.0)
Hemoglobin: 10.5 g/dL — ABNORMAL LOW (ref 12.0–15.0)
MCH: 29.4 pg (ref 26.0–34.0)
MCHC: 34.5 g/dL (ref 30.0–36.0)
MCV: 85.2 fL (ref 80.0–100.0)
Platelets: 190 10*3/uL (ref 150–400)
RBC: 3.57 MIL/uL — ABNORMAL LOW (ref 3.87–5.11)
RDW: 14.6 % (ref 11.5–15.5)
WBC: 14.7 10*3/uL — ABNORMAL HIGH (ref 4.0–10.5)
nRBC: 0 % (ref 0.0–0.2)

## 2021-10-14 LAB — FETAL SCREEN: Fetal Screen: NEGATIVE

## 2021-10-14 MED ORDER — ACETAMINOPHEN 500 MG PO TABS
1000.0000 mg | ORAL_TABLET | Freq: Four times a day (QID) | ORAL | Status: DC
  Administered 2021-10-14 – 2021-10-15 (×7): 1000 mg via ORAL
  Filled 2021-10-14 (×7): qty 2

## 2021-10-14 MED ORDER — RHO D IMMUNE GLOBULIN 1500 UNIT/2ML IJ SOSY
300.0000 ug | PREFILLED_SYRINGE | Freq: Once | INTRAMUSCULAR | Status: AC
  Administered 2021-10-14: 11:00:00 300 ug via INTRAVENOUS
  Filled 2021-10-14: qty 2

## 2021-10-14 NOTE — Op Note (Signed)
[name], [name] MEDICAL RECORD NO: [id number] ACCOUNT NO: [id number] DATE OF BIRTH: 12/02/1995 FACILITY: [facility] LOCATION: [facility] PHYSICIAN: Suzy Bouchard, MD  Operative Report   DATE OF PROCEDURE: 10/13/2021  PREOPERATIVE DIAGNOSES: 1.  39+2 weeks estimated gestational age. 2.  Active phase arrest.  POSTOPERATIVE DIAGNOSES: 1.  39+2 weeks estimated gestational age. 2.  Active phase arrest. 3.  Vigorous female delivered.  PROCEDURE:  Primary low transverse cesarean section.  ANESTHESIA: Surgical dosing of continuous lumbar epidural.  SURGEON:  Suzy Bouchard, MD  FIRST ASSISTANT:  Haroldine Laws, certified nurse midwife.  INDICATIONS:  A 25 year old gravida 2, para 0 at 39+2 weeks estimated gestational age, who was brought in to the hospital a day before and underwent an induction of labor for size greater than dates.  The patient progressed to 4 cm and developed swelling  of the cervix and did not progress past 4 cm despite adequate [place] units.  The patient remained at 4 cm for 5 hours.  DESCRIPTION OF PROCEDURE:  After adequate surgical dosing of continuous lumbar epidural, the patient's abdomen was prepped and draped in normal sterile fashion.  The patient did receive 3 grams IV Ancef for surgical prophylaxis and 500 mg azithromycin  for surgical prophylaxis.  Timeout was performed.  A Traxi abdominal elevator was used to elevate the pannus off the lower abdomen.  A Pfannenstiel incision was made 2 fingerbreadths above the symphysis pubis.  Sharp dissection was used to identify the  fascia.  Fascia was opened in the midline and opened in a transverse fashion.  The superior aspect of the fascia was grasped with Kocher clamps and the recti muscles were dissected free.  The inferior aspect of the fascia was grasped with Kocher clamps.   Pyramidalis muscle was dissected free.  Peritoneal cavity was opened sharply.  A low transverse uterine incision was  made.  Upon entry into the endometrial cavity, clear fluid resulted.  Wedged fetal head was then brought to the incision and a Kiwi bell  vacuum was applied to the occiput and with one gentle pull, the head was delivered and the vacuum was removed.  Shoulders and body were delivered after reducing a loose nuchal cord.  Vigorous female was then dried on the patient's abdomen for 30 seconds  as per neonatologist.  Cord was clamped at 30 seconds and infant was passed to him and he assigned Apgar scores of 9 and 9, fetal weight 3980 grams.  The placenta was manually delivered and the uterus was exteriorized.  The endometrial cavity was wiped  clean with laparotomy tape.  Uterine incision was closed with 1 chromic suture in a running locking fashion.  Two additional figure-of-eights were required for good hemostasis.  Fallopian tubes and ovaries appeared normal.  Uterus was placed back into  the abdomen and the abdomen was irrigated and the pericolic gutters were wiped clean with laparotomy tape.  The uterine incision again appeared hemostatic.  The fascia was then closed with 0 Vicryl suture in a running nonlocking fashion.  Two separate  sutures were used.  Fascial edges were injected with a solution of 60 mL of 0.5% Marcaine plus 20 mL normal saline.  40 mL of the solution was injected.  Subcutaneous tissues were irrigated and bovied.  Given the depth of the subcutaneous tissues of 3.5  cm, the subcutaneous layer was closed with a 2-0 chromic suture and the skin was then reapproximated with Insorb absorbable staples.  Additional 30 mL of Marcaine solution was  injected beneath the skin.  Sterile dressing applied.  There were no  complications.  QUANTITATIVE BLOOD LOSS: 685 mL.  INTRAOPERATIVE FLUIDS:  900 mL.  URINE OUTPUT:  150 mL.  The patient did receive 60 mg intravenous Toradol at the end of the case.   NIK D: 10/13/2021 1:05:00 pm T: 10/14/2021 1:46:00 am  JOB: [id number]/ [id number]

## 2021-10-14 NOTE — Lactation Note (Signed)
This note was copied from a baby's chart. Lactation Consultation Note  Patient Name: [name] [id number] Date: 10/14/2021 Reason for consult: Follow-up assessment;Mother's request;Primapara;Term;Other (Comment) (observation) Age:27 hours  Lactation follow-up. Parents called out for observed feeding to determine if baby is successful at transferring while feeding. Baby is now 66 hours old, has not voided in 16 hours (12:30am), and has yet to stool. Baby is positioned in appropriate football position at the R breast with nipple shield. Mom has already latched baby- baby has flanged upper and bottom lips, has a rhythmic sucking pattern, with observation no audible swallows were heard by LC, possible 1 deep jaw movement could have indicated a swallow. Baby remains at breast without becoming frustrated for her efforts, and parents report she is content post feedings. LC taught and demonstrated hand expression on L breast as baby fed. LC was able to hand express however it was low volumes, not enough to capture. LC discussed with mom possible avenues to ensure adequate intake: -BF on demand -DEBP set-up and use every 3 hours -Supplement post feedings of 10-12mL w/ choice of donor milk.  Mom voices she is agreeable with plan for this evening to see if we can get baby to catch up on output appropriate for HOL.  Maternal Data Has patient been taught Hand Expression?: Yes Does the patient have breastfeeding experience prior to this delivery?: No  Feeding Mother's Current Feeding Choice: Breast Milk  LATCH Score Latch: Grasps breast easily, tongue down, lips flanged, rhythmical sucking.  Audible Swallowing: None  Type of Nipple: Inverted  Comfort (Breast/Nipple): Soft / non-tender  Hold (Positioning): No assistance needed to correctly position infant at breast.  LATCH Score: 6   Lactation Tools Discussed/Used Tools: Nipple Dorris Carnes;Pump Nipple shield size: 24 Breast pump type:  Double-Electric Breast Pump Pump Education: Setup, frequency, and cleaning;Milk Storage Reason for Pumping: additional stimulation Pumping frequency: q 3 hours  Interventions Interventions: Assisted with latch;Hand express;Education;DEBP (Donor milk, transfer, lack of output)  Discharge    Consult Status Consult Status: Follow-up Date: 10/15/21 Follow-up type: In-patient    Danford Bad 10/14/2021, 4:17 PM

## 2021-10-14 NOTE — Progress Notes (Signed)
Postop Day  1  Subjective: no complaints, up ad lib, voiding, tolerating PO, and has not had a flatus or bowel movement yet.  Doing well, no concerns. Ambulating without difficulty, pain managed with PO meds, tolerating regular diet, and voiding without difficulty.   No fever/chills, chest pain, shortness of breath, nausea/vomiting, or leg pain. No nipple or breast pain. No headache, visual changes, or RUQ/epigastric pain.  Objective: BP 102/61 (BP Location: Right Arm)    Pulse (!) 104    Temp 97.7 F (36.5 C) (Oral)    Resp (!) 28    Ht '5\' 5"'  (1.651 m)    Wt (!) 151.5 kg Comment: 334lbs   LMP 01/05/2021 Comment: EDD based off LMP   SpO2 97%    Breastfeeding Unknown    BMI 55.58 kg/m    Physical Exam:  General: alert, cooperative, and appears stated age Breasts: soft/nontender CV: RRR Pulm: nl effort, CTABL Abdomen: soft, non-tender, active bowel sounds Uterine Fundus: firm Incision: healing well, no significant drainage, no dehiscence Perineum: minimal edema, intact Lochia: appropriate DVT Evaluation: No evidence of DVT seen on physical exam. Negative Homan's sign. No cords or calf tenderness. No significant calf/ankle edema.  Recent Labs    10/13/21 1406 10/14/21 0543  HGB 12.5 10.5*  HCT 35.3* 30.4*  WBC 17.9* 14.7*  PLT 227 190    Assessment/Plan: 25 y.o. G2P1011 postpartum day # 1  -Continue routine postpartum care -Lactation consult PRN for breastfeeding -Discussed contraceptive options including implant, IUDs hormonal and non-hormonal, injection, pills/ring/patch, condoms, and NFP.  -Acute blood loss anemia - hemodynamically stable and asymptomatic; start PO ferrous sulfate BID with stool softeners  -Immunization status:   needs MMR and Varicella prior to discharge    Disposition: Continue inpatient postpartum care   LOS: 2 days     ----- Avelino Leeds Certified Nurse Midwife [facility]

## 2021-10-14 NOTE — Lactation Note (Signed)
This note was copied from a baby's chart. Lactation Consultation Note  Patient Name: Girl Shaylie Eklund [id number] Date: 10/14/2021 Reason for consult: Follow-up assessment;Primapara;Term;Other (Comment) (c-section) Age:27 hours  Lactation follow-up. Baby is now 63 hours old. Baby had several feedings documented overnight, however no poop at this time. Mom continues to use size 32mm NS due to flat/inverted nipples bilaterally.  Upon entry baby was at breast in football hold, mom is reporting with some feeding the sensation of baby being clamped on nipple at times, but no pinching or pain. Baby was not active at breast as she was just finishing a feeding at this time. [facility] student reviewed output expectations within first 24 hours and still waiting on a poop- with this guidance was given about a potential change to current feeding plan if baby doesn't stool by a certain time.  [employer] student asks for mom to call at next feeding so we can observe a feeding to ensure transfer, swallowing, and satiety.  Mom does voice confidence in feeding and has no questions or concerns at this time- Mom has support with her sister also being a LC.  Whiteboard has LC name/number- mom to call.  Maternal Data Has patient been taught Hand Expression?: Yes Does the patient have breastfeeding experience prior to this delivery?: No  Feeding Mother's Current Feeding Choice: Breast Milk  LATCH Score Latch:  (baby had just finished feeding)                  Lactation Tools Discussed/Used Tools: Nipple Shields Nipple shield size: 24  Interventions Interventions: Breast feeding basics reviewed (nipple shield education)  Discharge    Consult Status Consult Status: Follow-up Date: 10/14/21 Follow-up type: In-patient    Danford Bad 10/14/2021, 11:10 AM

## 2021-10-14 NOTE — Anesthesia Postprocedure Evaluation (Signed)
Anesthesia Post Note  Patient: Belisa Yuhasz  Procedure(s) Performed: CESAREAN SECTION  Patient location during evaluation: [facility] Anesthesia Type: Epidural Level of consciousness: awake and awake and alert Pain management: satisfactory to patient Vital Signs Assessment: post-procedure vital signs reviewed and stable Respiratory status: spontaneous breathing Cardiovascular status: stable Postop Assessment: epidural receding Anesthetic complications: no   No notable events documented.   Last Vitals:  Vitals:   10/13/21 2317 10/14/21 0312  BP: 128/84 115/73  Pulse: (!) 102 (!) 101  Resp: 20 18  Temp: 36.7 C 36.8 C  SpO2: 96% 96%    Last Pain:  Vitals:   10/14/21 0312  TempSrc: Oral  PainSc:                  VAN STAVEREN,Noris Kulinski

## 2021-10-15 DIAGNOSIS — O9081 Anemia of the puerperium: Secondary | ICD-10-CM | POA: Diagnosis not present

## 2021-10-15 LAB — RHOGAM INJECTION: Unit division: 0

## 2021-10-15 MED ORDER — COCONUT OIL OIL: 1.0000 "application " | TOPICAL_OIL | 0 refills | Status: DC | PRN

## 2021-10-15 MED ORDER — IBUPROFEN 600 MG PO TABS: 600.0000 mg | ORAL_TABLET | Freq: Four times a day (QID) | ORAL | 0 refills | Status: DC

## 2021-10-15 MED ORDER — SENNOSIDES-DOCUSATE SODIUM 8.6-50 MG PO TABS: 2.0000 | ORAL_TABLET | Freq: Every day | ORAL | 0 refills | Status: DC

## 2021-10-15 MED ORDER — DIPHENHYDRAMINE HCL 25 MG PO CAPS: 25.0000 mg | ORAL_CAPSULE | Freq: Four times a day (QID) | ORAL | 0 refills | Status: DC | PRN

## 2021-10-15 MED ORDER — WITCH HAZEL-GLYCERIN EX PADS: 1.0000 "application " | MEDICATED_PAD | CUTANEOUS | 12 refills | Status: DC | PRN

## 2021-10-15 MED ORDER — OXYCODONE HCL 5 MG PO TABS: 5.0000 mg | ORAL_TABLET | Freq: Four times a day (QID) | ORAL | 0 refills | Status: AC | PRN

## 2021-10-15 MED ORDER — ACETAMINOPHEN 500 MG PO TABS: 1000.0000 mg | ORAL_TABLET | Freq: Four times a day (QID) | ORAL | 0 refills | Status: DC

## 2021-10-15 MED ORDER — SIMETHICONE 80 MG PO CHEW: 80.0000 mg | CHEWABLE_TABLET | ORAL | 0 refills | Status: DC | PRN

## 2021-10-15 NOTE — Lactation Note (Signed)
This note was copied from a baby's chart. Lactation Consultation Note  Patient Name: [name] [id number] Date: 10/15/2021 Reason for consult: Follow-up assessment;Primapara;Term;Other (Comment) (c-section) Age:27 hours  Lactation follow-up. Parents desire discharge today if cleared. Mom followed feeding instructions overnight to include: baby at breast followed by 58mL of donor milk. Mom only pumped 2x, but received a total of 97mL (30mL each pumping session). Baby has voided and stooled- 1 poop/2 pees in past 44 hours. LC discussed with mom feeding plan for home and importance of adequate intake. Instructed to continue with putting baby to breast on demand, use of personal DEBP every 3 hours, offering of EBM as supplement based on volumes- or need to provide formula supplementation to equal appropriate volumes. Mom verbalized understanding, states she is confident with the plan, with latching/feeding baby, and expresses no questions or concerns. Encouraged mom to call for ongoing support through lactation services once she is home as needed.  Maternal Data Has patient been taught Hand Expression?: Yes Does the patient have breastfeeding experience prior to this delivery?: No  Feeding Mother's Current Feeding Choice: Breast Milk  LATCH Score                    Lactation Tools Discussed/Used Tools: Pump;Nipple Shields Nipple shield size: 24 Breast pump type: Double-Electric Breast Pump Reason for Pumping: additional stimulation Pumping frequency: 2x overnight  Interventions Interventions: DEBP;Education  Discharge Discharge Education: Warning signs for feeding baby;Outpatient recommendation Pump: Personal  Consult Status Consult Status: Complete Date: 10/15/21 Follow-up type: Call as needed    Danford Bad 10/15/2021, 10:58 AM

## 2021-10-15 NOTE — Progress Notes (Signed)
Discharge instructions reviewed . Pt verbalized understanding. Pt. Discharged via wheelchair

## 2021-10-15 NOTE — Progress Notes (Signed)
Post Partum Day 2 Subjective: Doing well, no complaints.  Tolerating regular diet, pain with PO meds, voiding and ambulating without difficulty.  No CP SOB Fever,Chills, N/V or leg pain; denies nipple or breast pain; no HA change of vision, RUQ/epigastric pain  Objective: BP 116/82 (BP Location: Right Arm)    Pulse 96    Temp 97.6 F (36.4 C) (Oral)    Resp 17    Ht '5\' 5"'  (1.651 m)    Wt (!) 151.5 kg Comment: 334lbs   LMP 01/05/2021 Comment: EDD based off LMP   SpO2 99%    Breastfeeding Unknown    BMI 55.58 kg/m    Physical Exam:  General: NAD Breasts: soft/nontender CV: RRR Pulm: nl effort, CTABL Abdomen: soft, NT, BS x 4 Incision: honeycomb Dsg CDI Lochia: scant Uterine Fundus: fundus firm and 1 fb below umbilicus DVT Evaluation: no cords, ttp LEs   Recent Labs    10/13/21 1406 10/14/21 0543  HGB 12.5 10.5*  HCT 35.3* 30.4*  WBC 17.9* 14.7*  PLT 227 190    Assessment/Plan: 25 y.o. G2P1011 postpartum day # 2  - Continue routine PP care - Lactation consultprn  - Discussed contraceptive options including implant, IUDs hormonal and non-hormonal, injection, pills/ring/patch, condoms, and NFP.  - Acute blood loss anemia - hemodynamically stable and asymptomatic; continue po ferrous sulfate BID with stool softeners  - Immunization status: Needs varicella, MMR prior to DC   Disposition: Does desire Dc home today.     Francetta Found, CNM 10/15/2021 5:59 PM

## 2021-10-16 ENCOUNTER — Telehealth: Payer: Self-pay

## 2021-10-16 NOTE — Telephone Encounter (Signed)
Transition Care Management Unsuccessful Follow-up Telephone Call  Date of discharge and from where:  10/15/2021-[facility]  Attempts:  1st Attempt  Reason for unsuccessful TCM follow-up call:  Left voice message

## 2021-10-18 NOTE — Telephone Encounter (Signed)
Transition Care Management Unsuccessful Follow-up Telephone Call  Date of discharge and from where:  10/15/2021-[facility]  Attempts:  2nd Attempt  Reason for unsuccessful TCM follow-up call:  Left voice message

## 2021-10-21 NOTE — Telephone Encounter (Signed)
Transition Care Management Unsuccessful Follow-up Telephone Call  Date of discharge and from where:  10/15/2021 from [facility]  Attempts:  3rd Attempt  Reason for unsuccessful TCM follow-up call:  Unable to reach patient  .

## 2021-10-28 DIAGNOSIS — Z30011 Encounter for initial prescription of contraceptive pills: Secondary | ICD-10-CM | POA: Diagnosis not present

## 2021-11-25 DIAGNOSIS — Z30430 Encounter for insertion of intrauterine contraceptive device: Secondary | ICD-10-CM | POA: Diagnosis not present

## 2021-12-08 DIAGNOSIS — R7989 Other specified abnormal findings of blood chemistry: Secondary | ICD-10-CM | POA: Diagnosis not present

## 2021-12-24 DIAGNOSIS — Z30431 Encounter for routine checking of intrauterine contraceptive device: Secondary | ICD-10-CM | POA: Diagnosis not present

## 2021-12-24 DIAGNOSIS — N921 Excessive and frequent menstruation with irregular cycle: Secondary | ICD-10-CM | POA: Diagnosis not present

## 2022-01-14 ENCOUNTER — Other Ambulatory Visit: Payer: Self-pay

## 2022-01-14 NOTE — Patient Outreach (Signed)
Care Coordination ? ?01/14/2022 ? ?Cathy Cockrum ?23-Aug-1996 ?[id number] ? ?01/14/2022 ?Name: Darcy Barbara MRN: [id number] DOB: 01-26-1996 ? ?Referred by: Pcp, No ?Reason for referral : High Risk Managed Medicaid (Unsuccessful Telephone Outreach) ? ? ?An unsuccessful telephone outreach was attempted today. The patient was referred to the case management team for assistance with care management and care coordination.   ? ?Follow Up Plan: A HIPAA compliant phone message was left for the patient providing contact information and requesting a return call.  The Managed Medicaid care management team will reach out to the patient again over the next 7 - 14 days.  ? ? ?Virgina Norfolk RN, BSN ?Community Care Coordinator ?[place]  [facility] ?Mobile: [phone]  ? ?

## 2022-01-14 NOTE — Patient Instructions (Signed)
[name] ,  ? ?The [facility] Managed Care Team is available to provide assistance to you with your healthcare needs at no cost and as a benefit of your [facility] Health plan.  ? ?I'm sorry I was unable to reach you today for our scheduled appointment. Our care guide will call you to reschedule our telephone appointment. Please call me at the number below. I am available to be of assistance to you regarding your healthcare needs. .  ? ?Thank you,  ? ?Virgina Norfolk RN, BSN ?Community Care Coordinator ?[place]  [facility] ?Mobile: [phone]  ?

## 2022-01-15 ENCOUNTER — Telehealth: Payer: Self-pay

## 2022-01-15 NOTE — Telephone Encounter (Signed)
.. ?  Medicaid Managed Care  ? ?Unsuccessful Outreach Note ? ?01/15/2022 ?Name: [name] MRN: [id number] DOB: 09/22/1996 ? ?Referred by: Pcp, No ?Reason for referral : High Risk Managed Medicaid (Called the patient today to get her rescheduled with the MM RNCM. I left my name and number on her VM.) ? ? ?An unsuccessful telephone outreach was attempted today. The patient was referred to the case management team for assistance with care management and care coordination.  ? ?Follow Up Plan: The care management team will reach out to the patient again over the next 7 days.  ? ?Weston Settle ?Care Guide, High Risk Medicaid Managed Care ?Embedded Care Coordination ?[place]  [facility]  ? ? ?  ?

## 2022-01-19 ENCOUNTER — Telehealth: Payer: Self-pay

## 2022-01-19 NOTE — Telephone Encounter (Signed)
.. ?  Medicaid Managed Care  ? ?Unsuccessful Outreach Note ? ?01/19/2022 ?Name: [name] MRN: [id number] DOB: Oct 19, 1996 ? ?Referred by: Pcp, No ?Reason for referral : High Risk Managed Medicaid (I called the patient today to get her rescheduled with the MM RNCM. I left my name and number on her VM.) ? ? ?Third unsuccessful telephone outreach was attempted today. The patient was referred to the case management team for assistance with care management and care coordination. The patient's primary care provider has been notified of our unsuccessful attempts to make or maintain contact with the patient. The care management team is pleased to engage with this patient at any time in the future should he/she be interested in assistance from the care management team.  ? ?Follow Up Plan: We have been unable to make contact with the patient for follow up. The care management team is available to follow up with the patient after provider conversation with the patient regarding recommendation for care management engagement and subsequent re-referral to the care management team.  ? ? ?Weston Settle ?Care Guide, High Risk Medicaid Managed Care ?Embedded Care Coordination ?[place] [employer]  [facility]  ? ? ?SIGNATURE  ?

## 2022-01-29 DIAGNOSIS — N939 Abnormal uterine and vaginal bleeding, unspecified: Secondary | ICD-10-CM | POA: Diagnosis not present

## 2022-01-30 ENCOUNTER — Emergency Department
Admission: EM | Admit: 2022-01-30 | Discharge: 2022-01-30 | Disposition: A | Discharge status: Home / Self Care | Payer: Medicaid Other | Attending: Emergency Medicine | Admitting: Emergency Medicine

## 2022-01-30 ENCOUNTER — Encounter: Payer: Self-pay | Admitting: Emergency Medicine

## 2022-01-30 ENCOUNTER — Other Ambulatory Visit: Payer: Self-pay

## 2022-01-30 DIAGNOSIS — N939 Abnormal uterine and vaginal bleeding, unspecified: Secondary | ICD-10-CM | POA: Insufficient documentation

## 2022-01-30 LAB — CBC WITH DIFFERENTIAL/PLATELET
Abs Immature Granulocytes: 0.01 10*3/uL (ref 0.00–0.07)
Basophils Absolute: 0 10*3/uL (ref 0.0–0.1)
Basophils Relative: 1 %
Eosinophils Absolute: 0.1 10*3/uL (ref 0.0–0.5)
Eosinophils Relative: 1 %
HCT: 40.8 % (ref 36.0–46.0)
Hemoglobin: 13.6 g/dL (ref 12.0–15.0)
Immature Granulocytes: 0 %
Lymphocytes Relative: 34 %
Lymphs Abs: 2.4 10*3/uL (ref 0.7–4.0)
MCH: 28.6 pg (ref 26.0–34.0)
MCHC: 33.3 g/dL (ref 30.0–36.0)
MCV: 85.9 fL (ref 80.0–100.0)
Monocytes Absolute: 0.5 10*3/uL (ref 0.1–1.0)
Monocytes Relative: 7 %
Neutro Abs: 4 10*3/uL (ref 1.7–7.7)
Neutrophils Relative %: 57 %
Platelets: 328 10*3/uL (ref 150–400)
RBC: 4.75 MIL/uL (ref 3.87–5.11)
RDW: 13.6 % (ref 11.5–15.5)
WBC: 7 10*3/uL (ref 4.0–10.5)
nRBC: 0 % (ref 0.0–0.2)

## 2022-01-30 LAB — POC URINE PREG, ED: Preg Test, Ur: NEGATIVE

## 2022-01-30 NOTE — ED Triage Notes (Signed)
C/O vaginal bleeding since November.  Seen by GYN for same yesterday..  Arrives today seeking a "second opinion". ?

## 2022-01-30 NOTE — ED Provider Notes (Signed)
? ?[facility] ?Provider Note ? ? ? Event Date/Time  ? First MD Initiated Contact with Patient 01/30/22 1151   ?  (approximate) ? ? ?History  ? ?Vaginal Bleeding ? ? ?HPI ? ?[name] is a 26 y.o. female with past medical history of vaginal bleeding who presents with concern for ongoing vaginal bleeding.  Patient notes that since her pregnancy last year she has had irregular menstrual bleeding.  She gave birth in December and since that time is had pretty much daily bleeding.  When she is doing strenuous activity at work the bleeding is worse than when she is at rest it is quite minimal.  Does not soak through pads but is wearing a pad daily.  She had an IUD placed several months ago.  Followed up with Dr. Ouida Sills yesterday and the plan was to reassess in several months to decide whether to remove the IUD.  Returns today because she would like a second opinion.  Nuys lightheadedness chest pain shortness of breath.  No abnormal vaginal discharge.  Does have some minimal abdominal cramping. ?  ? ?Past Medical History:  ?Diagnosis Date  ? Medical history non-contributory   ? ? ?Patient Active Problem List  ? Diagnosis Date Noted  ? Postoperative state 10/13/2021  ? Encounter for planned induction of labor 10/12/2021  ? Uterine size date discrepancy, third trimester 10/12/2021  ? Morbid obesity with BMI of 50.0-59.9, adult ([place]) 10/12/2021  ? Obesity affecting pregnancy in third trimester 10/06/2021  ? Macrosomia 10/06/2021  ? Supervision of other high risk pregnancies, third trimester 03/04/2021  ? ? ? ?Physical Exam  ?Triage Vital Signs: ?ED Triage Vitals  ?Enc Vitals Group  ?   BP 01/30/22 1153 105/73  ?   Pulse Rate 01/30/22 1153 85  ?   Resp 01/30/22 1153 16  ?   Temp 01/30/22 1153 98.1 ?F (36.7 ?C)  ?   Temp Source 01/30/22 1153 Oral  ?   SpO2 01/30/22 1153 98 %  ?   Weight 01/30/22 1133 (!) 334 lb (151.5 kg)  ?   Height 01/30/22 1133 5\' 5"  (1.651 m)  ?   Head Circumference --   ?    Peak Flow --   ?   Pain Score 01/30/22 1133 0  ?   Pain Loc --   ?   Pain Edu? --   ?   Excl. in [place]? --   ? ? ?Most recent vital signs: ?Vitals:  ? 01/30/22 1153  ?BP: 105/73  ?Pulse: 85  ?Resp: 16  ?Temp: 98.1 ?F (36.7 ?C)  ?SpO2: 98%  ? ? ? ?General: Awake, no distress.  ?CV:  Good peripheral perfusion.  ?Resp:  Normal effort.  ?Abd:  No distention.  Abdomen is soft and nontender ?Neuro:             Awake, Alert, Oriented x 3  ?Other:   ? ? ?ED Results / Procedures / Treatments  ?Labs ?(all labs ordered are listed, but only abnormal results are displayed) ?Labs Reviewed  ?CBC WITH DIFFERENTIAL/PLATELET  ?POC URINE PREG, ED  ? ? ? ?EKG ? ? ? ? ?RADIOLOGY ? ? ? ?PROCEDURES: ? ?Critical Care performed: No ? ?Procedures ? ? ?MEDICATIONS ORDERED IN ED: ?Medications - No data to display ? ? ?IMPRESSION / MDM / ASSESSMENT AND PLAN / ED COURSE  ?I reviewed the triage vital signs and the nursing notes. ?             ?               ? ?  Differential diagnosis includes, but is not limited to, abnormal uterine bleeding, pregnancy, PCOS ? ? 26 year old female presents with abnormal uterine bleeding.  This is been going on for months despite having an IUD placed.  She followed up with GYN yesterday but was not satisfied with the visit so presents today for ongoing second opinion.  Vital signs within normal limits.  She has no symptoms of anemia such as lightheadedness chest pain shortness of breath.  Does have mild abdominal cramping but no significant pain abdomen is benign.  Pregnancy test is negative.  Hemoglobin today is normal at 13.  Suspect abnormal uterine bleeding secondary to hormonal cause.  Agree with ongoing GYN follow-up.  Do not feel further treatment is needed today given no significant anemia despite bleeding for several months. ?Clinical Course as of 01/30/22 1305  ?Fri Jan 30, 2022  ?1257 Preg Test, Ur: Negative [KM]  ?1302 Hemoglobin: 13.6 [KM]  ?  ?Clinical Course User Index ?[KM] Rada Hay, MD   ? ? ? ?FINAL CLINICAL IMPRESSION(S) / ED DIAGNOSES  ? ?Final diagnoses:  ?Abnormal uterine bleeding  ? ? ? ?Rx / DC Orders  ? ?ED Discharge Orders   ? ? None  ? ?  ? ? ? ?Note:  This document was prepared using Dragon voice recognition software and may include unintentional dictation errors. ?  ?Rada Hay, MD ?01/30/22 1305 ? ?

## 2022-01-30 NOTE — Discharge Instructions (Signed)
Your pregnancy test was negative.  Your blood counts were normal indicating that you are not losing a significant amount of blood with your vaginal bleeding.  Please follow-up with OB/GYN.  You can follow-up with Dr. Leafy Ro who is listed above. ?

## 2022-02-02 ENCOUNTER — Telehealth: Payer: Self-pay

## 2022-02-02 NOTE — Telephone Encounter (Signed)
Transition Care Management Unsuccessful Follow-up Telephone Call ? ?Date of discharge and from where:  01/30/2022-[facility] ? ?Attempts:  1st Attempt ? ?Reason for unsuccessful TCM follow-up call:  Left voice message ? ?  ?

## 2022-02-03 NOTE — Telephone Encounter (Signed)
Transition Care Management Unsuccessful Follow-up Telephone Call ? ?Date of discharge and from where:  01/30/2022-[facility] ? ?Attempts:  2nd Attempt ? ?Reason for unsuccessful TCM follow-up call:  Left voice message ? ?  ?

## 2022-02-04 NOTE — Telephone Encounter (Signed)
Transition Care Management Unsuccessful Follow-up Telephone Call ? ?Date of discharge and from where:  01/30/2022-[facility] ? ?Attempts:  3rd Attempt ? ?Reason for unsuccessful TCM follow-up call:  Left voice message ? ?  ?

## 2022-03-25 ENCOUNTER — Encounter: Payer: Self-pay | Admitting: Obstetrics and Gynecology

## 2022-05-03 ENCOUNTER — Ambulatory Visit
Admission: EM | Admit: 2022-05-03 | Discharge: 2022-05-03 | Disposition: A | Discharge status: Home / Self Care | Payer: BC Managed Care – PPO | Attending: Emergency Medicine | Admitting: Emergency Medicine

## 2022-05-03 DIAGNOSIS — J019 Acute sinusitis, unspecified: Secondary | ICD-10-CM

## 2022-05-03 MED ORDER — AMOXICILLIN-POT CLAVULANATE 875-125 MG PO TABS
1.0000 | ORAL_TABLET | Freq: Two times a day (BID) | ORAL | 0 refills | Status: AC
Start: 2022-05-03 — End: 2022-05-13

## 2022-05-03 NOTE — ED Provider Notes (Signed)
[facility] [place] [facility] [place]   Name: [name] DOB: [birth year] MRN: [id number] CSN: [id number] PCP: Pcp, No  Arrival date and time:  05/03/22 1343  Chief Complaint:  Headache and Nasal Congestion   NOTE: Prior to seeing the patient today, I have reviewed the triage nursing documentation and vital signs. Clinical staff has updated patient's PMH/PSHx, current medication list, and drug allergies/intolerances to ensure comprehensive history available to assist in medical decision making.   History:   HPI: [name] is a 26 y.o. female who presents today with complaints of sinus congestion and headache x5 days.  Patient states she has tried over-the-counter Tylenol for her pain, but that has not helped.  She has not tried any other medications.  She has recently stopped breast-feeding.  Symptoms include left-sided nasal congestion and pressure, occasional dry cough.  She denies fever chills body aches sneezing or coughing.   Past Medical History:  Diagnosis Date  . Medical history non-contributory     Past Surgical History:  Procedure Laterality Date  . CESAREAN SECTION  10/13/2021   Procedure: CESAREAN SECTION;  Surgeon: Schermerhorn, Ihor Austin, MD;  Location: [facility] ORS;  Service: Obstetrics;;  . NO PAST SURGERIES    . WISDOM TOOTH EXTRACTION      Family History  Problem Relation Age of Onset  . Lupus Mother   . Diabetes Maternal Aunt     Social History   Tobacco Use  . Smoking status: Never  . Smokeless tobacco: Never  Vaping Use  . Vaping Use: Never used  Substance Use Topics  . Alcohol use: No  . Drug use: No    Patient Active Problem List   Diagnosis Date Noted  . Postoperative state 10/13/2021  . Encounter for planned induction of labor 10/12/2021  . Uterine size date discrepancy, third trimester 10/12/2021  . Morbid obesity with BMI of 50.0-59.9, adult ([facility]) 10/12/2021  . Obesity affecting pregnancy in third trimester 10/06/2021  .  Macrosomia 10/06/2021  . Supervision of other high risk pregnancies, third trimester 03/04/2021    Home Medications:    Current Meds  Medication Sig  . acetaminophen (TYLENOL) 500 MG tablet Take 2 tablets (1,000 mg total) by mouth every 6 (six) hours.  . coconut oil OIL Apply 1 application topically as needed.  . diphenhydrAMINE (BENADRYL) 25 mg capsule Take 1 capsule (25 mg total) by mouth every 6 (six) hours as needed for itching.  Marland Kitchen ibuprofen (ADVIL) 600 MG tablet Take 1 tablet (600 mg total) by mouth every 6 (six) hours.  . Prenatal Vit-Fe Fumarate-FA (PRENATAL MULTIVITAMIN) TABS tablet Take 1 tablet by mouth daily at 12 noon.  . senna-docusate (SENOKOT-S) 8.6-50 MG tablet Take 2 tablets by mouth daily.  . simethicone (MYLICON) 80 MG chewable tablet Chew 1 tablet (80 mg total) by mouth as needed for flatulence.  Marland Kitchen witch hazel-glycerin (TUCKS) pad Apply 1 application topically as needed for hemorrhoids.    Allergies:   Patient has no known allergies.  Review of Systems (ROS): Review of Systems  Constitutional:  Positive for fatigue. Negative for activity change, appetite change, diaphoresis and fever.  HENT:  Positive for congestion, ear pain, postnasal drip, sinus pressure and sinus pain. Negative for ear discharge, facial swelling, hearing loss, sneezing and sore throat.   Eyes:  Negative for pain.  Respiratory:  Positive for cough. Negative for shortness of breath and wheezing.   Gastrointestinal:  Negative for constipation, nausea and vomiting.  Musculoskeletal:  Positive for myalgias.  All other systems reviewed and are negative.    Vital Signs: Today's Vitals   05/03/22 1413 05/03/22 1415  BP:  (!) 124/92  Pulse:  78  Resp:  18  Temp:  98.3 F (36.8 C)  TempSrc:  Oral  SpO2:  100%  Weight: (!) 308 lb (139.7 kg)   Height: 5\' 5"  (1.651 m)   PainSc: 10-Worst pain ever     Physical Exam: Physical Exam Vitals and nursing note reviewed.  HENT:     Head:  Normocephalic.     Right Ear: A middle ear effusion is present.     Left Ear: Tympanic membrane normal.  No middle ear effusion.     Nose: Congestion present.     Right Sinus: No maxillary sinus tenderness or frontal sinus tenderness.     Left Sinus: Maxillary sinus tenderness and frontal sinus tenderness present.     Mouth/Throat:     Mouth: Mucous membranes are moist.     Pharynx: Posterior oropharyngeal erythema present.     Tonsils: No tonsillar exudate or tonsillar abscesses.  Cardiovascular:     Rate and Rhythm: Normal rate and regular rhythm.     Heart sounds: Normal heart sounds.  Pulmonary:     Effort: Pulmonary effort is normal.     Breath sounds: Normal breath sounds.  Musculoskeletal:     Cervical back: Normal range of motion.  Lymphadenopathy:     Cervical: No cervical adenopathy.  Skin:    General: Skin is warm and dry.  Neurological:     Mental Status: She is alert.     Urgent Care Treatments / Results:   LABS: PLEASE NOTE: all labs that were ordered this encounter are listed, however only abnormal results are displayed. Labs Reviewed - No data to display  EKG: -None  RADIOLOGY: No results found.  PROCEDURES: Procedures  MEDICATIONS RECEIVED THIS VISIT: Medications - No data to display  PERTINENT CLINICAL COURSE NOTES/UPDATES:   Initial Impression / Assessment and Plan / Urgent Care Course:  Pertinent labs & imaging results that were available during my care of the patient were personally reviewed by me and considered in my medical decision making (see lab/imaging section of note for values and interpretations).  [name] is a 26 y.o. female who presents to [facility] today with complaints of sinus pressure, diagnosed with bacterial sinusitis, and treated as such with the medications below. NP and patient reviewed discharge instructions below during visit.   Patient is well appearing overall in clinic today. She does not appear to be in  any acute distress. Presenting symptoms (see HPI) and exam as documented above.   I have reviewed the follow up and strict return precautions for any new or worsening symptoms. Patient is aware of symptoms that would be deemed urgent/emergent, and would thus require further evaluation either here or in the emergency department. At the time of discharge, she verbalized understanding and consent with the discharge plan as it was reviewed with her. All questions were fielded by provider and/or clinic staff prior to patient discharge.    Final Clinical Impressions / Urgent Care Diagnoses:   Final diagnoses:  Acute non-recurrent sinusitis, unspecified location    New Prescriptions:  [place] Controlled Substance Registry consulted? Not Applicable  Meds ordered this encounter  Medications  . amoxicillin-clavulanate (AUGMENTIN) 875-125 MG tablet    Sig: Take 1 tablet by mouth every 12 (twelve) hours for 10 days.    Dispense:  20 tablet  Refill:  0      Discharge Instructions      You were seen for sinus pressure and are being treated for bacterial sinusitis.   - Take the antibiotics as prescribed until they're finished. If you think you're having a reaction, stop the medication, take benadryl and go to the nearest urgent care/emergency room. Take a probiotic while taking the antibiotic to decrease the chances of stomach upset.  -Use over-the-counter Flonase, 1 spray in each nostril 2 times a day to help with your sinus congestion. -Continue treating your pain with Tylenol and ibuprofen, rotating between the 2. -Follow-up if your symptoms worsen or do not resolve after completing antibiotics.  Take care, Dr. Sharlet Salina, NP-c      Recommended Follow up Care:  Patient encouraged to follow up with the following provider within the specified time frame, or sooner as dictated by the severity of her symptoms. As always, she was instructed that for any urgent/emergent care needs, she should seek  care either here or in the emergency department for more immediate evaluation.   Bailey Mech, DNP, NP-c   Bailey Mech, NP 05/03/22 [id number]

## 2022-05-03 NOTE — Discharge Instructions (Signed)
You were seen for sinus pressure and are being treated for bacterial sinusitis.   - Take the antibiotics as prescribed until they're finished. If you think you're having a reaction, stop the medication, take benadryl and go to the nearest urgent care/emergency room. Take a probiotic while taking the antibiotic to decrease the chances of stomach upset.  -Use over-the-counter Flonase, 1 spray in each nostril 2 times a day to help with your sinus congestion. -Continue treating your pain with Tylenol and ibuprofen, rotating between the 2. -Follow-up if your symptoms worsen or do not resolve after completing antibiotics.  Take care, Dr. Sharlet Salina, NP-c

## 2022-05-03 NOTE — ED Triage Notes (Signed)
Pt c/o migrains, left side facial swelling, nasal congestion x5days  Pt last took tylenol at 12:30pm

## 2022-07-30 ENCOUNTER — Ambulatory Visit
Admission: EM | Admit: 2022-07-30 | Discharge: 2022-07-30 | Disposition: A | Discharge status: Home / Self Care | Payer: Medicaid Other | Attending: Physician Assistant | Admitting: Physician Assistant

## 2022-07-30 DIAGNOSIS — R051 Acute cough: Secondary | ICD-10-CM | POA: Diagnosis not present

## 2022-07-30 DIAGNOSIS — U071 COVID-19: Secondary | ICD-10-CM | POA: Insufficient documentation

## 2022-07-30 DIAGNOSIS — Z20822 Contact with and (suspected) exposure to covid-19: Secondary | ICD-10-CM | POA: Diagnosis not present

## 2022-07-30 DIAGNOSIS — J029 Acute pharyngitis, unspecified: Secondary | ICD-10-CM | POA: Insufficient documentation

## 2022-07-30 LAB — SARS CORONAVIRUS 2 BY RT PCR: SARS Coronavirus 2 by RT PCR: POSITIVE — AB

## 2022-07-30 LAB — GROUP A STREP BY PCR: Group A Strep by PCR: NOT DETECTED

## 2022-07-30 MED ORDER — LIDOCAINE VISCOUS HCL 2 % MT SOLN: 15.0000 mL | OROMUCOSAL | 0 refills | Status: DC | PRN

## 2022-07-30 MED ORDER — MOLNUPIRAVIR 200 MG PO CAPS: 4.0000 | ORAL_CAPSULE | Freq: Two times a day (BID) | ORAL | 0 refills | Status: AC

## 2022-07-30 NOTE — ED Provider Notes (Signed)
[facility]    CSN: [id number] Arrival date & time: 07/30/22  1311      History   Chief Complaint Chief Complaint  Patient presents with   Sore Throat   Fever   Generalized Body Aches   Covid Exposure    HPI [name] is a 26 y.o. female presenting for fever up to 101 degrees, fatigue, body aches, sore throat, cough and congestion.  Symptoms started yesterday.  She has been around her boyfriend who recently was diagnosed with COVID.  She is concerned about COVID infection.  She has been taking Tylenol for symptoms and did have Tylenol today.  Temperature is currently 98.8 degrees.  She does not report any wheezing or breathing difficulty, abdominal pain, vomiting or diarrhea.  Her medical history is significant for obesity.  HPI  Past Medical History:  Diagnosis Date   Medical history non-contributory     Patient Active Problem List   Diagnosis Date Noted   Postoperative state 10/13/2021   Encounter for planned induction of labor 10/12/2021   Uterine size date discrepancy, third trimester 10/12/2021   Morbid obesity with BMI of 50.0-59.9, adult ([place]) 10/12/2021   Obesity affecting pregnancy in third trimester 10/06/2021   Macrosomia 10/06/2021   Supervision of other high risk pregnancies, third trimester 03/04/2021    Past Surgical History:  Procedure Laterality Date   CESAREAN SECTION  10/13/2021   Procedure: CESAREAN SECTION;  Surgeon: Ouida Sills, Gwen Her, MD;  Location: [facility] ORS;  Service: Obstetrics;;   NO PAST SURGERIES     WISDOM TOOTH EXTRACTION      OB History     Gravida  2   Para  1   Term  1   Preterm      AB  1   Living  1      SAB  1   IAB      Ectopic      Multiple  0   Live Births  1            Home Medications    Prior to Admission medications   Medication Sig Start Date End Date Taking? Authorizing Provider  acetaminophen (TYLENOL) 500 MG tablet Take 2 tablets (1,000 mg total) by mouth every 6  (six) hours. 10/15/21  Yes McVey, Murray Hodgkins, CNM  lidocaine (XYLOCAINE) 2 % solution Use as directed 15 mLs in the mouth or throat every 3 (three) hours as needed. 07/30/22  Yes Danton Clap, PA-C  molnupiravir EUA (LAGEVRIO) 200 MG CAPS capsule Take 4 capsules (800 mg total) by mouth 2 (two) times daily for 5 days. 07/30/22 08/04/22 Yes Laurene Footman B, PA-C  coconut oil OIL Apply 1 application topically as needed. 10/15/21   McVey, Murray Hodgkins, CNM  diphenhydrAMINE (BENADRYL) 25 mg capsule Take 1 capsule (25 mg total) by mouth every 6 (six) hours as needed for itching. 10/15/21   McVey, Murray Hodgkins, CNM  ibuprofen (ADVIL) 600 MG tablet Take 1 tablet (600 mg total) by mouth every 6 (six) hours. 10/15/21   McVey, Murray Hodgkins, CNM  Prenatal Vit-Fe Fumarate-FA (PRENATAL MULTIVITAMIN) TABS tablet Take 1 tablet by mouth daily at 12 noon.    [provider]  senna-docusate (SENOKOT-S) 8.6-50 MG tablet Take 2 tablets by mouth daily. 10/16/21   McVey, Murray Hodgkins, CNM  simethicone (MYLICON) 80 MG chewable tablet Chew 1 tablet (80 mg total) by mouth as needed for flatulence. 10/15/21   McVey, Murray Hodgkins, CNM  witch hazel-glycerin (TUCKS)  pad Apply 1 application topically as needed for hemorrhoids. 10/15/21   McVey, Murray Hodgkins, CNM    Family History Family History  Problem Relation Age of Onset   Lupus Mother    Diabetes Maternal Aunt     Social History Social History   Tobacco Use   Smoking status: Never   Smokeless tobacco: Never  Vaping Use   Vaping Use: Never used  Substance Use Topics   Alcohol use: No   Drug use: No     Allergies   Patient has no known allergies.   Review of Systems Review of Systems  Constitutional:  Positive for fatigue and fever. Negative for chills and diaphoresis.  HENT:  Positive for congestion, rhinorrhea and sore throat. Negative for ear pain, sinus pressure and sinus pain.   Respiratory:  Positive for cough. Negative for shortness of breath.    Cardiovascular:  Negative for chest pain.  Gastrointestinal:  Negative for abdominal pain, diarrhea, nausea and vomiting.  Musculoskeletal:  Positive for myalgias. Negative for arthralgias.  Skin:  Negative for rash.  Neurological:  Negative for weakness and headaches.  Hematological:  Negative for adenopathy.     Physical Exam Triage Vital Signs ED Triage Vitals  Enc Vitals Group     BP      Pulse      Resp      Temp      Temp src      SpO2      Weight      Height      Head Circumference      Peak Flow      Pain Score      Pain Loc      Pain Edu?      Excl. in [place]?    No data found.  Updated Vital Signs BP 118/76 (BP Location: Right Arm)   Pulse 83   Temp 98.8 F (37.1 C) (Oral)   Resp 16   Ht 5\' 5"  (1.651 m)   Wt (!) 312 lb (141.5 kg)   LMP 06/27/2022 (Exact Date)   SpO2 96%   Breastfeeding No   BMI 51.92 kg/m   Physical Exam Vitals and nursing note reviewed.  Constitutional:      General: She is not in acute distress.    Appearance: Normal appearance. She is obese. She is not ill-appearing or toxic-appearing.  HENT:     Head: Normocephalic and atraumatic.     Nose: Congestion present.     Mouth/Throat:     Mouth: Mucous membranes are moist.     Pharynx: Oropharynx is clear. Posterior oropharyngeal erythema present.  Eyes:     General: No scleral icterus.       Right eye: No discharge.        Left eye: No discharge.     Conjunctiva/sclera: Conjunctivae normal.  Cardiovascular:     Rate and Rhythm: Normal rate and regular rhythm.     Heart sounds: Normal heart sounds.  Pulmonary:     Effort: Pulmonary effort is normal. No respiratory distress.     Breath sounds: Normal breath sounds.  Musculoskeletal:     Cervical back: Neck supple.  Skin:    General: Skin is dry.  Neurological:     General: No focal deficit present.     Mental Status: She is alert. Mental status is at baseline.     Motor: No weakness.     Gait: Gait normal.  Psychiatric:  Mood and Affect: Mood normal.        Behavior: Behavior normal.        Thought Content: Thought content normal.      UC Treatments / Results  Labs (all labs ordered are listed, but only abnormal results are displayed) Labs Reviewed  SARS CORONAVIRUS 2 BY RT PCR - Abnormal; Notable for the following components:      Result Value   SARS Coronavirus 2 by RT PCR POSITIVE (*)    All other components within normal limits  GROUP A STREP BY PCR    EKG   Radiology No results found.  Procedures Procedures (including critical care time)  Medications Ordered in UC Medications - No data to display  Initial Impression / Assessment and Plan / UC Course  I have reviewed the triage vital signs and the nursing notes.  Pertinent labs & imaging results that were available during my care of the patient were reviewed by me and considered in my medical decision making (see chart for details).   26 year old female presents for fever, fatigue, body aches, cough, congestion and sore throat.  Symptoms started yesterday.  Positive COVID exposure.  Vitals all normal and stable and she is overall well-appearing.  Exam significant for nasal congestion and erythema posterior pharynx.  Chest clear auscultation heart regular rate and rhythm.  PCR strep test negative.  PCR COVID test obtained.  Positive COVID.  Reviewed with COVID test with patient.  Reviewed current CDC guidelines, isolation protocol and ED precautions.  We will treat at this time with molnupiravir.  Patient is on birth control.  Advised OTC Mucinex, rest, fluids, viscous lidocaine cane sent to pharmacy.  Also advised she may use Chloraseptic spray and continue ibuprofen and/or Tylenol.  Reviewed return precautions.   Final Clinical Impressions(s) / UC Diagnoses   Final diagnoses:  COVID-19  Sore throat  Acute cough  Exposure to COVID-19 virus     Discharge Instructions      -Your COVID test is positive.  You need to  isolate 5 days and wear mask for 5 days.  Today is the first full day of symptoms with today's the first day. - I have sent antiviral medicine to pharmacy as well as lidocaine for your throat.  Would also take Mucinex and increase your fluids and rest. - Return if you have any uncontrolled fever, weakness or breathing trouble.  Go to ER for any severe acute symptoms.     ED Prescriptions     Medication Sig Dispense Auth. Provider   molnupiravir EUA (LAGEVRIO) 200 MG CAPS capsule Take 4 capsules (800 mg total) by mouth 2 (two) times daily for 5 days. 40 capsule Laurene Footman B, PA-C   lidocaine (XYLOCAINE) 2 % solution Use as directed 15 mLs in the mouth or throat every 3 (three) hours as needed. 100 mL Danton Clap, PA-C      PDMP not reviewed this encounter.   Danton Clap, PA-C 07/30/22 1501

## 2022-07-30 NOTE — Discharge Instructions (Addendum)
-  Your COVID test is positive.  You need to isolate 5 days and wear mask for 5 days.  Today is the first full day of symptoms with today's the first day. - I have sent antiviral medicine to pharmacy as well as lidocaine for your throat.  Would also take Mucinex and increase your fluids and rest. - Return if you have any uncontrolled fever, weakness or breathing trouble.  Go to ER for any severe acute symptoms.

## 2022-07-30 NOTE — ED Triage Notes (Signed)
Patient having sore throat, body aches, and fever onset a day ago. States her daughter was sick and diagnosed with bronchitis, all other teste negative. States her partner tested positive for COVID on Tuesday.

## 2022-12-04 ENCOUNTER — Ambulatory Visit
Admission: EM | Admit: 2022-12-04 | Discharge: 2022-12-04 | Disposition: A | Discharge status: Home / Self Care | Payer: Commercial Managed Care - PPO | Attending: Family Medicine | Admitting: Family Medicine

## 2022-12-04 DIAGNOSIS — Z1152 Encounter for screening for COVID-19: Secondary | ICD-10-CM | POA: Diagnosis not present

## 2022-12-04 DIAGNOSIS — H9209 Otalgia, unspecified ear: Secondary | ICD-10-CM | POA: Diagnosis not present

## 2022-12-04 DIAGNOSIS — R0981 Nasal congestion: Secondary | ICD-10-CM | POA: Diagnosis present

## 2022-12-04 DIAGNOSIS — J069 Acute upper respiratory infection, unspecified: Secondary | ICD-10-CM | POA: Insufficient documentation

## 2022-12-04 LAB — GROUP A STREP BY PCR: Group A Strep by PCR: NOT DETECTED

## 2022-12-04 LAB — SARS CORONAVIRUS 2 BY RT PCR: SARS Coronavirus 2 by RT PCR: NEGATIVE

## 2022-12-04 LAB — RAPID INFLUENZA A&B ANTIGENS
Influenza A (ARMC): NEGATIVE
Influenza B (ARMC): NEGATIVE

## 2022-12-04 NOTE — ED Triage Notes (Signed)
Pt c/o nasal drainage, otalgia bilaterally & HA x3 days. Denies any fevers,chills or bodyaches.

## 2022-12-04 NOTE — ED Provider Notes (Signed)
[facility]    CSN: [id number] Arrival date & time: 12/04/22  0806      History   Chief Complaint Chief Complaint  Patient presents with   Nasal Congestion   Headache   Otalgia    HPI [name] is a 27 y.o. female.   HPI   [name] presents for nasal congestion, ear pain and headache that started on Wednesday. She is not taken a Covid test.    Fever : no  Chills: no Sore throat: no   Cough: no Nasal congestion: yes Rhinorrhea: yes Myalgias: yes Appetite: normal  Hydration: normal  Abdominal pain: no Nausea: no Vomiting: no Diarrhea: No Rash: no  Sleep disturbance: no Headache: yes      Past Medical History:  Diagnosis Date   Medical history non-contributory     Patient Active Problem List   Diagnosis Date Noted   Postoperative state 10/13/2021   Encounter for planned induction of labor 10/12/2021   Uterine size date discrepancy, third trimester 10/12/2021   Morbid obesity with BMI of 50.0-59.9, adult ([place]) 10/12/2021   Obesity affecting pregnancy in third trimester 10/06/2021   Macrosomia 10/06/2021   Supervision of other high risk pregnancies, third trimester 03/04/2021    Past Surgical History:  Procedure Laterality Date   CESAREAN SECTION  10/13/2021   Procedure: CESAREAN SECTION;  Surgeon: Ouida Sills, Gwen Her, MD;  Location: [facility] ORS;  Service: Obstetrics;;   NO PAST SURGERIES     WISDOM TOOTH EXTRACTION      OB History     Gravida  2   Para  1   Term  1   Preterm      AB  1   Living  1      SAB  1   IAB      Ectopic      Multiple  0   Live Births  1            Home Medications    Prior to Admission medications   Medication Sig Start Date End Date Taking? Authorizing Provider  acetaminophen (TYLENOL) 500 MG tablet Take 2 tablets (1,000 mg total) by mouth every 6 (six) hours. 10/15/21  Yes McVey, Murray Hodgkins, CNM  coconut oil OIL Apply 1 application topically as needed. 10/15/21  Yes McVey,  Murray Hodgkins, CNM  diphenhydrAMINE (BENADRYL) 25 mg capsule Take 1 capsule (25 mg total) by mouth every 6 (six) hours as needed for itching. 10/15/21  Yes McVey, Murray Hodgkins, CNM  ibuprofen (ADVIL) 600 MG tablet Take 1 tablet (600 mg total) by mouth every 6 (six) hours. 10/15/21  Yes McVey, Murray Hodgkins, CNM  lidocaine (XYLOCAINE) 2 % solution Use as directed 15 mLs in the mouth or throat every 3 (three) hours as needed. 07/30/22  Yes Danton Clap, PA-C  Prenatal Vit-Fe Fumarate-FA (PRENATAL MULTIVITAMIN) TABS tablet Take 1 tablet by mouth daily at 12 noon.   Yes [provider]  senna-docusate (SENOKOT-S) 8.6-50 MG tablet Take 2 tablets by mouth daily. 10/16/21  Yes McVey, Murray Hodgkins, CNM  simethicone (MYLICON) 80 MG chewable tablet Chew 1 tablet (80 mg total) by mouth as needed for flatulence. 10/15/21  Yes McVey, Murray Hodgkins, CNM  witch hazel-glycerin (TUCKS) pad Apply 1 application topically as needed for hemorrhoids. 10/15/21  Yes McVey, Murray Hodgkins, CNM    Family History Family History  Problem Relation Age of Onset   Lupus Mother    Diabetes Maternal Aunt     Social  History Social History   Tobacco Use   Smoking status: Never   Smokeless tobacco: Never  Vaping Use   Vaping Use: Never used  Substance Use Topics   Alcohol use: No   Drug use: No     Allergies   Patient has no known allergies.   Review of Systems Review of Systems: negative unless otherwise stated in HPI.      Physical Exam Triage Vital Signs ED Triage Vitals [12/04/22 0849]  Enc Vitals Group     BP      Pulse      Resp      Temp      Temp src      SpO2      Weight (!) 312 lb (141.5 kg)     Height 5' 5"$  (1.651 m)     Head Circumference      Peak Flow      Pain Score 8     Pain Loc      Pain Edu?      Excl. in [place]?    No data found.  Updated Vital Signs BP 94/65 (BP Location: Left Arm)   Pulse 85   Temp 97.8 F (36.6 C) (Oral)   Resp 16   Ht 5' 5"$  (1.651 m)   Wt (!) 141.5 kg   SpO2  94%   BMI 51.92 kg/m   Visual Acuity Right Eye Distance:   Left Eye Distance:   Bilateral Distance:    Right Eye Near:   Left Eye Near:    Bilateral Near:     Physical Exam GEN:     alert, non-toxic appearing female in no distress    HENT:  mucus membranes moist, oropharyngeal without lesions or exudate, no tonsillar hypertrophy, mild oropharyngeal erythema, moderate erythematous edematous turbinates, clear nasal discharge, right TM effusion, left TM normal EYES:   pupils equal and reactive, no scleral injection or discharge NECK:  normal ROM, no meningismus   RESP:  no increased work of breathing, clear to auscultation bilaterally CVS:   regular rate and rhythm Skin:   warm and dry, no rash on visible skin    UC Treatments / Results  Labs (all labs ordered are listed, but only abnormal results are displayed) Labs Reviewed  SARS CORONAVIRUS 2 BY RT PCR  RAPID INFLUENZA A&B ANTIGENS  GROUP A STREP BY PCR    EKG   Radiology No results found.  Procedures Procedures (including critical care time)  Medications Ordered in UC Medications - No data to display  Initial Impression / Assessment and Plan / UC Course  I have reviewed the triage vital signs and the nursing notes.  Pertinent labs & imaging results that were available during my care of the patient were reviewed by me and considered in my medical decision making (see chart for details).       Pt is a 27 y.o. female who presents for 2 days of respiratory symptoms. [name] is afebrile here. Satting adequately on room air. Overall pt is non-toxic appearing, well hydrated, without respiratory distress. Pulmonary exam is unremarkable.  COVID and influenza testing obtained and were negative. Strep PCR is negative. History most consistent with viral respiratory illness. Discussed symptomatic treatment.  Explained lack of efficacy of antibiotics in viral disease.  Typical duration of symptoms discussed. Work note  provided.   Return and ED precautions given and voiced understanding. Discussed MDM, treatment plan and plan for follow-up with patient who agrees with plan.  Final Clinical Impressions(s) / UC Diagnoses   Final diagnoses:  Upper respiratory tract infection, unspecified type     Discharge Instructions      We will contact you if your COVID/influenza/strep test is positive.  Please quarantine while you wait for the results.  If your test is negative you may resume normal activities.  If your test is positive please continue to quarantine for at least 5 days from your symptom onset or until you are without a fever for at least 24 hours after the medications.    If your were prescribed medication, stop by the pharmacy to pick them up.   You can take Tylenol and/or Ibuprofen as needed for fever reduction and pain relief.    For cough: honey 1/2 to 1 teaspoon (you can dilute the honey in water or another fluid).  You can also use guaifenesin and dextromethorphan for cough. You can use a humidifier for chest congestion and cough.  If you don't have a humidifier, you can sit in the bathroom with the hot shower running.      For sore throat: try warm salt water gargles, Mucinex sore throat cough drops or cepacol lozenges, throat spray, warm tea or water with lemon/honey, popsicles or ice, or OTC cold relief medicine for throat discomfort. You can also purchase chloraseptic spray at the pharmacy or dollar store.   For congestion: take a daily anti-histamine like Zyrtec, Claritin, and a oral decongestant, such as pseudoephedrine.  You can also use Flonase 1-2 sprays in each nostril daily. Afrin is also a good option, if you do not have high blood pressure.    It is important to stay hydrated: drink plenty of fluids (water, gatorade/powerade/pedialyte, juices, or teas) to keep your throat moisturized and help further relieve irritation/discomfort.    Return or go to the Emergency Department if  symptoms worsen or do not improve in the next few days      ED Prescriptions   None    PDMP not reviewed this encounter.   Lyndee Hensen, DO 12/04/22 1011

## 2022-12-04 NOTE — Discharge Instructions (Signed)
We will contact you if your COVID/influenza/strep test is positive.  Please quarantine while you wait for the results.  If your test is negative you may resume normal activities.  If your test is positive please continue to quarantine for at least 5 days from your symptom onset or until you are without a fever for at least 24 hours after the medications.    If your were prescribed medication, stop by the pharmacy to pick them up.   You can take Tylenol and/or Ibuprofen as needed for fever reduction and pain relief.    For cough: honey 1/2 to 1 teaspoon (you can dilute the honey in water or another fluid).  You can also use guaifenesin and dextromethorphan for cough. You can use a humidifier for chest congestion and cough.  If you don't have a humidifier, you can sit in the bathroom with the hot shower running.      For sore throat: try warm salt water gargles, Mucinex sore throat cough drops or cepacol lozenges, throat spray, warm tea or water with lemon/honey, popsicles or ice, or OTC cold relief medicine for throat discomfort. You can also purchase chloraseptic spray at the pharmacy or dollar store.   For congestion: take a daily anti-histamine like Zyrtec, Claritin, and a oral decongestant, such as pseudoephedrine.  You can also use Flonase 1-2 sprays in each nostril daily. Afrin is also a good option, if you do not have high blood pressure.    It is important to stay hydrated: drink plenty of fluids (water, gatorade/powerade/pedialyte, juices, or teas) to keep your throat moisturized and help further relieve irritation/discomfort.    Return or go to the Emergency Department if symptoms worsen or do not improve in the next few days

## 2023-02-06 ENCOUNTER — Encounter: Payer: Self-pay | Admitting: Emergency Medicine

## 2023-02-06 ENCOUNTER — Ambulatory Visit
Admission: EM | Admit: 2023-02-06 | Discharge: 2023-02-06 | Disposition: A | Discharge status: Home / Self Care | Payer: Commercial Managed Care - PPO | Attending: Family Medicine | Admitting: Family Medicine

## 2023-02-06 DIAGNOSIS — J0100 Acute maxillary sinusitis, unspecified: Secondary | ICD-10-CM

## 2023-02-06 MED ORDER — AMOXICILLIN-POT CLAVULANATE 875-125 MG PO TABS: 1.0000 | ORAL_TABLET | Freq: Two times a day (BID) | ORAL | 0 refills | Status: DC

## 2023-02-06 NOTE — ED Provider Notes (Signed)
[facility]    CSN: [id number] Arrival date & time: 02/06/23  1232      History   Chief Complaint Chief Complaint  Patient presents with   Cough   Nasal Congestion    Cough   27 year old female presents for evaluation the above.  Patient states that she has had symptoms for the past 2 weeks.  She reports runny nose, congestion, and cough.  Her husband has been sick and her child is sick as well.  She has been using Mucinex, Claritin, and Flonase without resolution.  Patient is most bothered by the congestion.  She believes that she is suffering from sinusitis.    Patient Active Problem List   Diagnosis Date Noted   Postoperative state 10/13/2021   Encounter for planned induction of labor 10/12/2021   Uterine size date discrepancy, third trimester 10/12/2021   Morbid obesity with BMI of 50.0-59.9, adult 10/12/2021   Obesity affecting pregnancy in third trimester 10/06/2021   Macrosomia 10/06/2021   Supervision of other high risk pregnancies, third trimester 03/04/2021    Past Surgical History:  Procedure Laterality Date   CESAREAN SECTION  10/13/2021   Procedure: CESAREAN SECTION;  Surgeon: Schermerhorn, Ihor Austin, MD;  Location: [facility] ORS;  Service: Obstetrics;;   NO PAST SURGERIES     WISDOM TOOTH EXTRACTION      OB History     Gravida  2   Para  1   Term  1   Preterm      AB  1   Living  1      SAB  1   IAB      Ectopic      Multiple  0   Live Births  1            Home Medications    Prior to Admission medications   Medication Sig Start Date End Date Taking? Authorizing Provider  amoxicillin-clavulanate (AUGMENTIN) 875-125 MG tablet Take 1 tablet by mouth every 12 (twelve) hours. 02/06/23  Yes Tkai Large G, DO  acetaminophen (TYLENOL) 500 MG tablet Take 2 tablets (1,000 mg total) by mouth every 6 (six) hours. 10/15/21   McVey, Prudencio Pair, CNM  coconut oil OIL Apply 1 application topically as needed. 10/15/21   McVey, Prudencio Pair, CNM  diphenhydrAMINE (BENADRYL) 25 mg capsule Take 1 capsule (25 mg total) by mouth every 6 (six) hours as needed for itching. 10/15/21   McVey, Prudencio Pair, CNM  ibuprofen (ADVIL) 600 MG tablet Take 1 tablet (600 mg total) by mouth every 6 (six) hours. 10/15/21   McVey, Prudencio Pair, CNM  lidocaine (XYLOCAINE) 2 % solution Use as directed 15 mLs in the mouth or throat every 3 (three) hours as needed. 07/30/22   Shirlee Latch, PA-C  Prenatal Vit-Fe Fumarate-FA (PRENATAL MULTIVITAMIN) TABS tablet Take 1 tablet by mouth daily at 12 noon.    [provider]  senna-docusate (SENOKOT-S) 8.6-50 MG tablet Take 2 tablets by mouth daily. 10/16/21   McVey, Prudencio Pair, CNM  simethicone (MYLICON) 80 MG chewable tablet Chew 1 tablet (80 mg total) by mouth as needed for flatulence. 10/15/21   McVey, Prudencio Pair, CNM  witch hazel-glycerin (TUCKS) pad Apply 1 application topically as needed for hemorrhoids. 10/15/21   McVey, Prudencio Pair, CNM    Family History Family History  Problem Relation Age of Onset   Lupus Mother    Diabetes Maternal Aunt     Social History Social History   Tobacco  Use   Smoking status: Never   Smokeless tobacco: Never  Vaping Use   Vaping Use: Never used  Substance Use Topics   Alcohol use: No   Drug use: No     Allergies   Patient has no known allergies.   Review of Systems Review of Systems  Respiratory:  Positive for cough.    Per HPI  Physical Exam Triage Vital Signs ED Triage Vitals  Enc Vitals Group     BP 02/06/23 1250 104/63     Pulse Rate 02/06/23 1250 75     Resp 02/06/23 1250 14     Temp 02/06/23 1250 97.7 F (36.5 C)     Temp Source 02/06/23 1250 Oral     SpO2 02/06/23 1250 97 %     Weight 02/06/23 1242 (!) 317 lb (143.8 kg)     Height 02/06/23 1242 5\' 5"  (1.651 m)     Head Circumference --      Peak Flow --      Pain Score 02/06/23 1242 0     Pain Loc --      Pain Edu? --      Excl. in [place]? --    Updated Vital Signs BP 104/63 (BP  Location: Right Arm)   Pulse 75   Temp 97.7 F (36.5 C) (Oral)   Resp 14   Ht 5\' 5"  (1.651 m)   Wt (!) 143.8 kg   LMP 01/26/2023 (Exact Date)   SpO2 97%   Breastfeeding No   BMI 52.75 kg/m   Visual Acuity Right Eye Distance:   Left Eye Distance:   Bilateral Distance:    Right Eye Near:   Left Eye Near:    Bilateral Near:     Physical Exam Vitals and nursing note reviewed.  Constitutional:      General: She is not in acute distress.    Appearance: Normal appearance.  HENT:     Head: Normocephalic and atraumatic.     Right Ear: Tympanic membrane normal.     Left Ear: Tympanic membrane normal.     Nose: Congestion present.  Cardiovascular:     Rate and Rhythm: Normal rate and regular rhythm.  Pulmonary:     Effort: Pulmonary effort is normal.     Breath sounds: Normal breath sounds. No wheezing, rhonchi or rales.  Neurological:     Mental Status: She is alert.    UC Treatments / Results  Labs (all labs ordered are listed, but only abnormal results are displayed) Labs Reviewed - No data to display  EKG   Radiology No results found.  Procedures Procedures (including critical care time)  Medications Ordered in UC Medications - No data to display  Initial Impression / Assessment and Plan / UC Course  I have reviewed the triage vital signs and the nursing notes.  Pertinent labs & imaging results that were available during my care of the patient were reviewed by me and considered in my medical decision making (see chart for details).    27 year old female presents with sinusitis.  Treating with Augmentin.   Final Clinical Impressions(s) / UC Diagnoses   Final diagnoses:  Acute maxillary sinusitis, recurrence not specified   Discharge Instructions   None    ED Prescriptions     Medication Sig Dispense Auth. Provider   amoxicillin-clavulanate (AUGMENTIN) 875-125 MG tablet Take 1 tablet by mouth every 12 (twelve) hours. 14 tablet [place], [place], [place]  PDMP not reviewed this encounter.   Tommie Sams, [place] 02/06/23 1409

## 2023-02-06 NOTE — ED Triage Notes (Signed)
Patient states that she has had nasal congestion, cough, and chest congestion for 2 weeks.  Patient denies fevers.

## 2023-03-18 ENCOUNTER — Ambulatory Visit (INDEPENDENT_AMBULATORY_CARE_PROVIDER_SITE_OTHER): Payer: Commercial Managed Care - PPO

## 2023-03-18 ENCOUNTER — Ambulatory Visit
Admission: EM | Admit: 2023-03-18 | Discharge: 2023-03-18 | Disposition: A | Discharge status: Home / Self Care | Payer: Commercial Managed Care - PPO | Attending: Emergency Medicine | Admitting: Emergency Medicine

## 2023-03-18 DIAGNOSIS — S8002XA Contusion of left knee, initial encounter: Secondary | ICD-10-CM

## 2023-03-18 DIAGNOSIS — M25562 Pain in left knee: Secondary | ICD-10-CM | POA: Diagnosis not present

## 2023-03-18 DIAGNOSIS — M546 Pain in thoracic spine: Secondary | ICD-10-CM

## 2023-03-18 DIAGNOSIS — Z043 Encounter for examination and observation following other accident: Secondary | ICD-10-CM | POA: Diagnosis not present

## 2023-03-18 DIAGNOSIS — M545 Low back pain, unspecified: Secondary | ICD-10-CM | POA: Diagnosis not present

## 2023-03-18 MED ORDER — BACLOFEN 10 MG PO TABS: 10.0000 mg | ORAL_TABLET | Freq: Three times a day (TID) | ORAL | 0 refills | Status: DC

## 2023-03-18 NOTE — ED Triage Notes (Addendum)
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 Pt states she is unable to file as workers comp because she waited too long.

## 2023-03-18 NOTE — Discharge Instructions (Addendum)
Your x-rays today did not demonstrate any evidence of fractures or dislocations.  You do have some joint space narrowing in your left knee which may be a result of the fall which may be contributing to your pain.  Use over-the-counter Tylenol and/or ibuprofen according to the package instructions as needed for pain and inflammation.  Take the baclofen every 8 hours as needed for your back pain.  This medication will help relax the muscles in your back but it will not make you drowsy.  You can apply ice or moist heat to your back and knee for torments at a time 2-3 times a day as needed for pain relief.  I am given you home physical therapy exercises for your back and your knee to help strengthen both, increase mobility, and aid in pain relief  If your symptoms continue, they worsen, I recommend that you follow-up with orthopedics as you may need designated physical therapy.

## 2023-03-18 NOTE — ED Provider Notes (Signed)
[facility]    CSN: [id number] Arrival date & time: 03/18/23  1751      History   Chief Complaint Chief Complaint  Patient presents with   Knee Pain   Back Pain    HPI [name] is a 27 y.o. female.   HPI  27 year old female with a past medical history of macrosomia and obesity presents for evaluation of back pain and left knee pain after suffering a ground-level fall 2 weeks ago.  She reports that she was working in the freezer at work when she slipped and landed flat on her back on the floor of the freezer and her left knee impacted a stack of buns and crates.  She is reporting pain in her entire back and also pain in her knee.  She denies any numbness or tingling in any of her extremities and she denies any bruising.  Past Medical History:  Diagnosis Date   Medical history non-contributory     Patient Active Problem List   Diagnosis Date Noted   Postoperative state 10/13/2021   Encounter for planned induction of labor 10/12/2021   Uterine size date discrepancy, third trimester 10/12/2021   Morbid obesity with BMI of 50.0-59.9, adult (HCC) 10/12/2021   Obesity affecting pregnancy in third trimester 10/06/2021   Macrosomia 10/06/2021   Supervision of other high risk pregnancies, third trimester 03/04/2021    Past Surgical History:  Procedure Laterality Date   CESAREAN SECTION  10/13/2021   Procedure: CESAREAN SECTION;  Surgeon: Feliberto Gottron, Ihor Austin, MD;  Location: [facility] ORS;  Service: Obstetrics;;   NO PAST SURGERIES     WISDOM TOOTH EXTRACTION      OB History     Gravida  2   Para  1   Term  1   Preterm      AB  1   Living  1      SAB  1   IAB      Ectopic      Multiple  0   Live Births  1            Home Medications    Prior to Admission medications   Medication Sig Start Date End Date Taking? Authorizing Provider  baclofen (LIORESAL) 10 MG tablet Take 1 tablet (10 mg total) by mouth 3 (three) times daily.  03/18/23  Yes Becky Augusta, NP  acetaminophen (TYLENOL) 500 MG tablet Take 2 tablets (1,000 mg total) by mouth every 6 (six) hours. 10/15/21   McVey, Prudencio Pair, CNM  amoxicillin-clavulanate (AUGMENTIN) 875-125 MG tablet Take 1 tablet by mouth every 12 (twelve) hours. 02/06/23   Tommie Sams, DO  coconut oil OIL Apply 1 application topically as needed. 10/15/21   McVey, Prudencio Pair, CNM  diphenhydrAMINE (BENADRYL) 25 mg capsule Take 1 capsule (25 mg total) by mouth every 6 (six) hours as needed for itching. 10/15/21   McVey, Prudencio Pair, CNM  ibuprofen (ADVIL) 600 MG tablet Take 1 tablet (600 mg total) by mouth every 6 (six) hours. 10/15/21   McVey, Prudencio Pair, CNM  lidocaine (XYLOCAINE) 2 % solution Use as directed 15 mLs in the mouth or throat every 3 (three) hours as needed. 07/30/22   Shirlee Latch, PA-C  Prenatal Vit-Fe Fumarate-FA (PRENATAL MULTIVITAMIN) TABS tablet Take 1 tablet by mouth daily at 12 noon.    [provider]  senna-docusate (SENOKOT-S) 8.6-50 MG tablet Take 2 tablets by mouth daily. 10/16/21   McVey, Prudencio Pair, CNM  simethicone (  MYLICON) 80 MG chewable tablet Chew 1 tablet (80 mg total) by mouth as needed for flatulence. 10/15/21   McVey, Prudencio Pair, CNM  witch hazel-glycerin (TUCKS) pad Apply 1 application topically as needed for hemorrhoids. 10/15/21   McVey, Prudencio Pair, CNM    Family History Family History  Problem Relation Age of Onset   Lupus Mother    Diabetes Maternal Aunt     Social History Social History   Tobacco Use   Smoking status: Never   Smokeless tobacco: Never  Vaping Use   Vaping Use: Never used  Substance Use Topics   Alcohol use: No   Drug use: No     Allergies   Patient has no known allergies.   Review of Systems Review of Systems  Musculoskeletal:  Positive for arthralgias and back pain. Negative for joint swelling.  Skin:  Negative for color change.  Neurological:  Negative for numbness.     Physical Exam Triage Vital  Signs ED Triage Vitals  Enc Vitals Group     BP 03/18/23 1818 110/81     Pulse Rate 03/18/23 1818 71     Resp --      Temp 03/18/23 1818 98.6 F (37 C)     Temp Source 03/18/23 1818 Oral     SpO2 03/18/23 1818 97 %     Weight 03/18/23 1808 (!) 317 lb (143.8 kg)     Height 03/18/23 1808 5\' 5"  (1.651 m)     Head Circumference --      Peak Flow --      Pain Score 03/18/23 1817 9     Pain Loc --      Pain Edu? --      Excl. in GC? --    No data found.  Updated Vital Signs BP 110/81 (BP Location: Left Arm)   Pulse 71   Temp 98.6 F (37 C) (Oral)   Ht 5\' 5"  (1.651 m)   Wt (!) 317 lb (143.8 kg)   LMP 03/04/2023 (Approximate)   SpO2 97%   BMI 52.75 kg/m   Visual Acuity Right Eye Distance:   Left Eye Distance:   Bilateral Distance:    Right Eye Near:   Left Eye Near:    Bilateral Near:     Physical Exam Vitals and nursing note reviewed.  Constitutional:      Appearance: Normal appearance. She is not ill-appearing.  HENT:     Head: Normocephalic and atraumatic.  Musculoskeletal:        General: Tenderness and signs of injury present. No swelling or deformity.  Skin:    General: Skin is warm and dry.     Capillary Refill: Capillary refill takes less than 2 seconds.     Findings: No bruising or erythema.  Neurological:     General: No focal deficit present.     Mental Status: She is alert and oriented to person, place, and time.      UC Treatments / Results  Labs (all labs ordered are listed, but only abnormal results are displayed) Labs Reviewed - No data to display  EKG   Radiology DG Lumbar Spine Complete  Result Date: 03/18/2023 CLINICAL DATA:  Status post fall 2 weeks ago, back pain EXAM: LUMBAR SPINE - COMPLETE 4+ VIEW COMPARISON:  None Available. FINDINGS: There is no evidence of lumbar spine fracture. Alignment is normal. Intervertebral disc spaces are maintained. IMPRESSION: Negative. Electronically Signed   By: Larose Hires D.O.   On: 03/18/2023  19:05   DG Thoracic Spine 2 View  Result Date: 03/18/2023 CLINICAL DATA:  Status post fall 2 weeks ago. EXAM: THORACIC SPINE 2 VIEWS COMPARISON:  None Available. FINDINGS: There is no evidence of thoracic spine fracture. Alignment is normal. Mild multilevel disc space narrowing and marginal spurring. IMPRESSION: No evidence of fracture. Mild multilevel degenerative disc disease. Electronically Signed   By: Larose Hires D.O.   On: 03/18/2023 19:04   DG Knee Complete 4 Views Left  Result Date: 03/18/2023 CLINICAL DATA:  Status post fall 2 weeks ago.  Left knee pain EXAM: LEFT KNEE - COMPLETE 4+ VIEW COMPARISON:  None Available. FINDINGS: No evidence of fracture, dislocation, or joint effusion. Osseous fragmentation at the tibial tuberosity, which may be sequela of prior Osgood-Schlatter disease. Mild medial tibiofemoral and patellofemoral joint space narrowing. Soft tissues are unremarkable. IMPRESSION: 1. No acute fracture or dislocation. 2. Mild medial tibiofemoral and patellofemoral joint space narrowing. Electronically Signed   By: Larose Hires D.O.   On: 03/18/2023 19:04    Procedures Procedures (including critical care time)  Medications Ordered in UC Medications - No data to display  Initial Impression / Assessment and Plan / UC Course  I have reviewed the triage vital signs and the nursing notes.  Pertinent labs & imaging results that were available during my care of the patient were reviewed by me and considered in my medical decision making (see chart for details).   Patient is a pleasant, nontoxic-appearing 27 year old female presenting for evaluation of back pain and left knee pain after suffering a ground-level fall 2 weeks ago as outlined in HPI above.  On exam patient does have midline spinous process tenderness in her upper and lower thoracic as well as lower lumbar vertebrae over the spinous process.  No step-off or deformity noted.  Patient does not have any tenderness with  palpation of the thoracic or lumbar paraspinous regions.  Additionally, she is complaining of pain with palpation over the tibial tuberosity as well as with palpation of the popliteal space of her left knee.  No tenderness to the patella, quadriceps complex, or medial or lateral joint line.  No tenderness with varus and valgus stress application.  There is no appreciable edema, ecchymosis, or erythema.  I will obtain radiographs of thoracic spine, lumbar spine, and left knee to rule out any bony abnormality.  Radiology impression of lumbar spine states there is no evidence of lumbar fracture and alignment is normal.  Intervertebral disc bases are maintained.  Negative exam.  Radiology impression of thoracic spine x-rays states there is no evidence of thoracic spine fracture.  Alignment is normal.  Multilevel mild disc space narrowing and marginal spurring.  Radiology impression of left knee x-rays states that there is no evidence of fracture, dislocation, or joint effusion.  Osseous fragmentation of the tibial tuberosity, which may be sequela of prior Osgood-Schlatter's disease.  Mild medial tibiofemoral and patellofemoral joint space narrowing.  Soft tissues are unremarkable.  I will discharge patient on the diagnosis of thoracic and lumbar back pain and contusion of her left knee.  She can use over-the-counter Tylenol and/or ibuprofen according to the package instructions as needed for pain.  I will give her prescription for baclofen that she can use as needed for muscle pain.  In addition, I will give her home physical therapy exercises to help with her back and the pain.   Final Clinical Impressions(s) / UC Diagnoses   Final diagnoses:  Acute midline thoracic back pain  Acute midline low back pain without sciatica  Contusion of left knee, initial encounter     Discharge Instructions      Your x-rays today did not demonstrate any evidence of fractures or dislocations.  You do have some  joint space narrowing in your left knee which may be a result of the fall which may be contributing to your pain.  Use over-the-counter Tylenol and/or ibuprofen according to the package instructions as needed for pain and inflammation.  Take the baclofen every 8 hours as needed for your back pain.  This medication will help relax the muscles in your back but it will not make you drowsy.  You can apply ice or moist heat to your back and knee for torments at a time 2-3 times a day as needed for pain relief.  I am given you home physical therapy exercises for your back and your knee to help strengthen both, increase mobility, and aid in pain relief  If your symptoms continue, they worsen, I recommend that you follow-up with orthopedics as you may need designated physical therapy.     ED Prescriptions     Medication Sig Dispense Auth. Provider   baclofen (LIORESAL) 10 MG tablet Take 1 tablet (10 mg total) by mouth 3 (three) times daily. 30 each Becky Augusta, NP      PDMP not reviewed this encounter.   Becky Augusta, NP 03/18/23 1920

## 2023-10-23 ENCOUNTER — Ambulatory Visit
Admission: EM | Admit: 2023-10-23 | Discharge: 2023-10-23 | Disposition: A | Discharge status: Home / Self Care | Payer: Commercial Managed Care - PPO | Attending: Family Medicine | Admitting: Family Medicine

## 2023-10-23 DIAGNOSIS — U071 COVID-19: Secondary | ICD-10-CM | POA: Insufficient documentation

## 2023-10-23 LAB — RESP PANEL BY RT-PCR (FLU A&B, COVID) ARPGX2
Influenza A by PCR: NEGATIVE
Influenza B by PCR: NEGATIVE
SARS Coronavirus 2 by RT PCR: POSITIVE — AB

## 2023-10-23 MED ORDER — PAXLOVID (300/100) 20 X 150 MG & 10 X 100MG PO TBPK: ORAL_TABLET | ORAL | 0 refills | Status: DC

## 2023-10-23 MED ORDER — PAXLOVID (300/100) 20 X 150 MG & 10 X 100MG PO TBPK: 3.0000 | ORAL_TABLET | Freq: Two times a day (BID) | ORAL | 0 refills | Status: DC

## 2023-10-23 NOTE — Discharge Instructions (Signed)

## 2023-10-23 NOTE — ED Provider Notes (Signed)
[facility]    CSN: [id number] Arrival date & time: 10/23/23  1131      History   Chief Complaint Chief Complaint  Patient presents with   Cough   Fever   Generalized Body Aches   Headache    HPI [name] is a 27 y.o. female.   HPI  History obtained from the patient. [name] presents for headache, cough, body aches, fever, nasal congestion, rhinorrhea, sore throat and chills that started on Thuirsday.  Has been taking Tylenol but none today.  Mucinex last night. Denies vomiting or diarrhea.  She was exposed to influenza.  Her daughter is sick with similar symptoms.        Past Medical History:  Diagnosis Date   Medical history non-contributory     Patient Active Problem List   Diagnosis Date Noted   Postoperative state 10/13/2021   Encounter for planned induction of labor 10/12/2021   Uterine size date discrepancy, third trimester 10/12/2021   Morbid obesity with BMI of 50.0-59.9, adult (HCC) 10/12/2021   Obesity affecting pregnancy in third trimester 10/06/2021   Macrosomia 10/06/2021   Supervision of other high risk pregnancies, third trimester 03/04/2021    Past Surgical History:  Procedure Laterality Date   CESAREAN SECTION  10/13/2021   Procedure: CESAREAN SECTION;  Surgeon: Feliberto Gottron, Ihor Austin, MD;  Location: [facility] ORS;  Service: Obstetrics;;   NO PAST SURGERIES     WISDOM TOOTH EXTRACTION      OB History     Gravida  2   Para  1   Term  1   Preterm      AB  1   Living  1      SAB  1   IAB      Ectopic      Multiple  0   Live Births  1            Home Medications    Prior to Admission medications   Medication Sig Start Date End Date Taking? Authorizing Provider  acetaminophen (TYLENOL) 500 MG tablet Take 2 tablets (1,000 mg total) by mouth every 6 (six) hours. 10/15/21   McVey, Prudencio Pair, CNM  amoxicillin-clavulanate (AUGMENTIN) 875-125 MG tablet Take 1 tablet by mouth every 12 (twelve) hours.  02/06/23   Tommie Sams, DO  baclofen (LIORESAL) 10 MG tablet Take 1 tablet (10 mg total) by mouth 3 (three) times daily. 03/18/23   Becky Augusta, NP  coconut oil OIL Apply 1 application topically as needed. 10/15/21   McVey, Prudencio Pair, CNM  diphenhydrAMINE (BENADRYL) 25 mg capsule Take 1 capsule (25 mg total) by mouth every 6 (six) hours as needed for itching. 10/15/21   McVey, Prudencio Pair, CNM  ibuprofen (ADVIL) 600 MG tablet Take 1 tablet (600 mg total) by mouth every 6 (six) hours. 10/15/21   McVey, Prudencio Pair, CNM  lidocaine (XYLOCAINE) 2 % solution Use as directed 15 mLs in the mouth or throat every 3 (three) hours as needed. 07/30/22   Shirlee Latch, PA-C  nirmatrelvir/ritonavir (PAXLOVID, 300/100,) 20 x 150 MG & 10 x 100MG  TBPK Take nirmatrelvir (150 mg) two tablets twice daily for 5 days and ritonavir (100 mg) one tablet twice daily for 5 days. 10/23/23   Katha Cabal, DO  Prenatal Vit-Fe Fumarate-FA (PRENATAL MULTIVITAMIN) TABS tablet Take 1 tablet by mouth daily at 12 noon.    [provider]  senna-docusate (SENOKOT-S) 8.6-50 MG tablet Take 2 tablets by mouth daily. 10/16/21  McVey, Prudencio Pair, CNM  simethicone (MYLICON) 80 MG chewable tablet Chew 1 tablet (80 mg total) by mouth as needed for flatulence. 10/15/21   McVey, Prudencio Pair, CNM  witch hazel-glycerin (TUCKS) pad Apply 1 application topically as needed for hemorrhoids. 10/15/21   McVey, Prudencio Pair, CNM    Family History Family History  Problem Relation Age of Onset   Lupus Mother    Diabetes Maternal Aunt     Social History Social History   Tobacco Use   Smoking status: Never   Smokeless tobacco: Never  Vaping Use   Vaping status: Never Used  Substance Use Topics   Alcohol use: No   Drug use: No     Allergies   Patient has no known allergies.   Review of Systems Review of Systems: negative unless otherwise stated in HPI.      Physical Exam Triage Vital Signs ED Triage Vitals [10/23/23 1309]   Encounter Vitals Group     BP      Systolic BP Percentile      Diastolic BP Percentile      Pulse      Resp      Temp      Temp src      SpO2      Weight      Height      Head Circumference      Peak Flow      Pain Score 9     Pain Loc      Pain Education      Exclude from Growth Chart    No data found.  Updated Vital Signs BP 105/72 (BP Location: Right Arm)   Pulse 85   Resp (!) 21   LMP 10/04/2023 (Approximate)   SpO2 94%   Visual Acuity Right Eye Distance:   Left Eye Distance:   Bilateral Distance:    Right Eye Near:   Left Eye Near:    Bilateral Near:     Physical Exam GEN:     alert, non-toxic appearing female in no distress    HENT:  mucus membranes moist, oropharyngeal without lesions or erythema, no tonsillar hypertrophy or exudates,  no nasal discharge EYES:   pupils equal and reactive, no scleral injection or discharge NECK:  normal ROM, no meningismus   RESP:  no increased work of breathing, clear to auscultation bilaterally CVS:   regular rate and rhythm Skin:   warm and dry, no rash on visible skin    UC Treatments / Results  Labs (all labs ordered are listed, but only abnormal results are displayed) Labs Reviewed  RESP PANEL BY RT-PCR (FLU A&B, COVID) ARPGX2 - Abnormal; Notable for the following components:      Result Value   SARS Coronavirus 2 by RT PCR POSITIVE (*)    All other components within normal limits    EKG   Radiology No results found.  Procedures Procedures (including critical care time)  Medications Ordered in UC Medications - No data to display  Initial Impression / Assessment and Plan / UC Course  I have reviewed the triage vital signs and the nursing notes.  Pertinent labs & imaging results that were available during my care of the patient were reviewed by me and considered in my medical decision making (see chart for details).       Pt is a 27 y.o. female who presents for 3 days of respiratory symptoms.  [name] is satting  94% on room air.  Overall pt is non-toxic appearing, well hydrated, without respiratory distress. Pulmonary exam is unremarkable.  COVID and influenza panel obtained and was positive for COVID.   History consistent with viral respiratory illness. Discussed symptomatic treatment.  After shared decision making patient is interested in antiviral therapy with Paxlovid.  Typical duration of symptoms discussed.  Work note provided.  Return and ED precautions given and voiced understanding. Discussed MDM, treatment plan and plan for follow-up with patient who agrees with plan.     Final Clinical Impressions(s) / UC Diagnoses   Final diagnoses:  COVID-19     Discharge Instructions      Your  test for COVID-19 was positive, meaning that you were infected with the novel coronavirus and could give the germ to others.  The recommendations suggest returning to normal activities when, for at least 24 hours, symptoms are improving overall, and if a fever was present, it has been gone without use of a fever-reducing medication.  You should wear a mask for the next 5 days to prevent the spread of disease. Please continue good preventive care measures, including:  frequent hand-washing, avoid touching your face, cover coughs/sneezes, stay out of crowds and keep a 6 foot distance from others.  Go to the nearest hospital emergency room if fever/cough/breathlessness are severe or illness seems like a threat to life.  If your were prescribed medication. Stop by the pharmacy to pick it up. You can take Tylenol and/or Ibuprofen as needed for fever reduction and pain relief.    For cough: honey 1/2 to 1 teaspoon (you can dilute the honey in water or another fluid).  You can also use guaifenesin and dextromethorphan for cough. You can use a humidifier for chest congestion and cough.  If you don't have a humidifier, you can sit in the bathroom with the hot shower running.      For sore throat: try  warm salt water gargles, Mucinex sore throat cough drops or cepacol lozenges, throat spray, warm tea or water with lemon/honey, popsicles or ice, or OTC cold relief medicine for throat discomfort. You can also purchase chloraseptic spray at the pharmacy or dollar store.   For congestion: take a daily anti-histamine like Zyrtec, Claritin, and a oral decongestant, such as pseudoephedrine.  You can also use Flonase 1-2 sprays in each nostril daily. Afrin is also a good option, if you do not have high blood pressure.    It is important to stay hydrated: drink plenty of fluids (water, gatorade/powerade/pedialyte, juices, or teas) to keep your throat moisturized and help further relieve irritation/discomfort.    Return or go to the Emergency Department if symptoms worsen or do not improve in the next few days      ED Prescriptions     Medication Sig Dispense Auth. Provider   nirmatrelvir/ritonavir (PAXLOVID, 300/100,) 20 x 150 MG & 10 x 100MG  TBPK Take nirmatrelvir (150 mg) two tablets twice daily for 5 days and ritonavir (100 mg) one tablet twice daily for 5 days. 30 tablet Katha Cabal, DO      PDMP not reviewed this encounter.   Katha Cabal, DO 10/23/23 1438

## 2023-10-23 NOTE — ED Triage Notes (Signed)
Sx since Thursday  Productive cough with green mucus  bodyaches Fever Headache Exposed to flu

## 2023-12-07 ENCOUNTER — Ambulatory Visit
Admission: EM | Admit: 2023-12-07 | Discharge: 2023-12-07 | Disposition: A | Discharge status: Home / Self Care | Payer: Commercial Managed Care - PPO | Attending: Physician Assistant | Admitting: Physician Assistant

## 2023-12-07 DIAGNOSIS — M545 Low back pain, unspecified: Secondary | ICD-10-CM

## 2023-12-07 DIAGNOSIS — S39012A Strain of muscle, fascia and tendon of lower back, initial encounter: Secondary | ICD-10-CM

## 2023-12-07 MED ORDER — NAPROXEN 500 MG PO TABS: 500.0000 mg | ORAL_TABLET | Freq: Two times a day (BID) | ORAL | 0 refills | Status: DC

## 2023-12-07 MED ORDER — KETOROLAC TROMETHAMINE 30 MG/ML IJ SOLN
30.0000 mg | Freq: Once | INTRAMUSCULAR | Status: AC
  Administered 2023-12-07: 30 mg via INTRAMUSCULAR

## 2023-12-07 MED ORDER — BACLOFEN 10 MG PO TABS: 10.0000 mg | ORAL_TABLET | Freq: Three times a day (TID) | ORAL | 0 refills | Status: DC | PRN

## 2023-12-07 NOTE — ED Triage Notes (Addendum)
Pt c/o mid back pain to tailbone and hips x1year  Pt states that she fell in May while at work. Pt states that she was evaluated for the pain but it was not workers comp.   Pt states that the pain has been present for the last 2 weeks.   Pt states that she fell 1 month ago by sliding out from underneath her.   Pt denies urinary issues or vaginal discharge/odor

## 2023-12-07 NOTE — ED Provider Notes (Signed)
[facility]    CSN: [id number] Arrival date & time: 12/07/23  1657      History   Chief Complaint Chief Complaint  Patient presents with   Fall    HPI [name] is a 28 y.o. female senting for approximately 2-week history of diffuse lumbar back pain.  Pain does not radiate to lower extremities and no associated numbness, weakness or tingling.  She reports most discomfort with prolonged sitting, standing and leaning forward.  Denies loss of bowel or bladder control.  Patient says about a month ago she slid out of her chair at work.  She reports last spring or summer she fell at work and that is the first time she experienced some lower back pain.  Reports symptoms got better after a week or 2 with a muscle relaxer.  She has not felt any back pain in the past 9 months until now.  Has tried Tylenol and heating pad at home without relief.  HPI  Past Medical History:  Diagnosis Date   Medical history non-contributory     Patient Active Problem List   Diagnosis Date Noted   Postoperative state 10/13/2021   Encounter for planned induction of labor 10/12/2021   Uterine size date discrepancy, third trimester 10/12/2021   Morbid obesity with BMI of 50.0-59.9, adult (HCC) 10/12/2021   Obesity affecting pregnancy in third trimester 10/06/2021   Macrosomia 10/06/2021   Supervision of other high risk pregnancies, third trimester 03/04/2021    Past Surgical History:  Procedure Laterality Date   CESAREAN SECTION  10/13/2021   Procedure: CESAREAN SECTION;  Surgeon: Feliberto Gottron, Ihor Austin, MD;  Location: [facility] ORS;  Service: Obstetrics;;   NO PAST SURGERIES     WISDOM TOOTH EXTRACTION      OB History     Gravida  2   Para  1   Term  1   Preterm      AB  1   Living  1      SAB  1   IAB      Ectopic      Multiple  0   Live Births  1            Home Medications    Prior to Admission medications   Medication Sig Start Date End Date Taking?  Authorizing Provider  baclofen (LIORESAL) 10 MG tablet Take 1 tablet (10 mg total) by mouth 3 (three) times daily as needed for muscle spasms. 12/07/23  Yes Eusebio Friendly B, PA-C  naproxen (NAPROSYN) 500 MG tablet Take 1 tablet (500 mg total) by mouth 2 (two) times daily. 12/07/23  Yes Shirlee Latch, PA-C    Family History Family History  Problem Relation Age of Onset   Lupus Mother    Diabetes Maternal Aunt     Social History Social History   Tobacco Use   Smoking status: Never   Smokeless tobacco: Never  Vaping Use   Vaping status: Never Used  Substance Use Topics   Alcohol use: No   Drug use: No     Allergies   Patient has no known allergies.   Review of Systems Review of Systems  Musculoskeletal:  Positive for back pain. Negative for arthralgias, gait problem and joint swelling.  Skin:  Negative for color change, rash and wound.  Neurological:  Negative for weakness and numbness.     Physical Exam Triage Vital Signs ED Triage Vitals  Encounter Vitals Group     BP  Systolic BP Percentile      Diastolic BP Percentile      Pulse      Resp      Temp      Temp src      SpO2      Weight      Height      Head Circumference      Peak Flow      Pain Score      Pain Loc      Pain Education      Exclude from Growth Chart    No data found.  Updated Vital Signs Pulse 72   Temp 98.4 F (36.9 C) (Oral)   Ht 5\' 5"  (1.651 m)   Wt (!) 317 lb (143.8 kg)   LMP 11/30/2023   SpO2 94%   BMI 52.75 kg/m     Physical Exam Vitals and nursing note reviewed.  Constitutional:      General: She is not in acute distress.    Appearance: Normal appearance. She is obese. She is not ill-appearing or toxic-appearing.  HENT:     Head: Normocephalic and atraumatic.  Eyes:     General: No scleral icterus.       Right eye: No discharge.        Left eye: No discharge.     Conjunctiva/sclera: Conjunctivae normal.  Cardiovascular:     Rate and Rhythm: Normal rate and  regular rhythm.  Pulmonary:     Effort: Pulmonary effort is normal. No respiratory distress.  Musculoskeletal:     Cervical back: Neck supple.     Lumbar back: Tenderness (diffuse TTP entire lumbar region) present. Decreased range of motion. Negative right straight leg raise test and negative left straight leg raise test.  Skin:    General: Skin is dry.  Neurological:     General: No focal deficit present.     Mental Status: She is alert. Mental status is at baseline.     Motor: No weakness.     Gait: Gait normal.  Psychiatric:        Mood and Affect: Mood normal.        Behavior: Behavior normal.      UC Treatments / Results  Labs (all labs ordered are listed, but only abnormal results are displayed) Labs Reviewed - No data to display  EKG   Radiology No results found.  Procedures Procedures (including critical care time)  Medications Ordered in UC Medications  ketorolac (TORADOL) 30 MG/ML injection 30 mg (30 mg Intramuscular Given 12/07/23 1814)    Initial Impression / Assessment and Plan / UC Course  I have reviewed the triage vital signs and the nursing notes.  Pertinent labs & imaging results that were available during my care of the patient were reviewed by me and considered in my medical decision making (see chart for details).   28 year old female presents for 2-week history of diffuse lumbar back pain.  Slid out of her chair at work a month ago.  Unsure if it is related.  Nearly a year ago she fell at work and that is the first time she experienced back pain.  However, she has not had any back pain in the past 9 months until now.  No radiation of discomfort to lower extremities, numbness/tingling or weakness.  No red flag signs or symptoms.  Exam consistent with lumbar strain.  Patient given 30 mg IM ketorolac in clinic for acute pain relief.  She has not tried  any NSAIDs at home.  Will start her on naproxen.  Also sent baclofen.  Encouraged heat, stretching.   Advised if no improvement in the next 7 to 10 days follow-up with PCP and consider physical therapy.  Information given for her to contact [facility] for appointment.  Thoroughly reviewed return and ER precautions.  Work note given.   Final Clinical Impressions(s) / UC Diagnoses   Final diagnoses:  Strain of lumbar region, initial encounter  Acute bilateral low back pain without sciatica     Discharge Instructions      BACK PAIN: Stressed avoiding painful activities . RICE (REST, ICE, COMPRESSION, ELEVATION) guidelines reviewed. May alternate ice and heat. Consider use of muscle rubs, Salonpas patches, etc. Use medications as directed including muscle relaxers if prescribed. Take anti-inflammatory medications as prescribed or OTC NSAIDs/Tylenol.  F/u with PCP in 7-10 days for reexamination if no improvement and consider making an appointment for physical therapy.  BACK PAIN RED FLAGS: If the back pain acutely worsens or there are any red flag symptoms such as numbness/tingling, leg weakness, saddle anesthesia, or loss of bowel/bladder control, go immediately to the ER. Follow up with [place] as scheduled or sooner if the pain does not begin to resolve or if it worsens before the follow up    [facility] [address] [place], [place] [address] Phone: [phone]     ED Prescriptions     Medication Sig Dispense Auth. Provider   naproxen (NAPROSYN) 500 MG tablet Take 1 tablet (500 mg total) by mouth 2 (two) times daily. 30 tablet Eusebio Friendly B, PA-C   baclofen (LIORESAL) 10 MG tablet Take 1 tablet (10 mg total) by mouth 3 (three) times daily as needed for muscle spasms. 30 each Gareth Morgan      PDMP not reviewed this encounter.   Shirlee Latch, PA-C 12/07/23 1821

## 2023-12-07 NOTE — Discharge Instructions (Addendum)
BACK PAIN: Stressed avoiding painful activities . RICE (REST, ICE, COMPRESSION, ELEVATION) guidelines reviewed. May alternate ice and heat. Consider use of muscle rubs, Salonpas patches, etc. Use medications as directed including muscle relaxers if prescribed. Take anti-inflammatory medications as prescribed or OTC NSAIDs/Tylenol.  F/u with PCP in 7-10 days for reexamination if no improvement and consider making an appointment for physical therapy.  BACK PAIN RED FLAGS: If the back pain acutely worsens or there are any red flag symptoms such as numbness/tingling, leg weakness, saddle anesthesia, or loss of bowel/bladder control, go immediately to the ER. Follow up with [place] as scheduled or sooner if the pain does not begin to resolve or if it worsens before the follow up    [facility] [name], [place] [address] Phone: [phone]

## 2024-03-28 ENCOUNTER — Encounter: Payer: Self-pay | Admitting: Emergency Medicine

## 2024-03-28 ENCOUNTER — Ambulatory Visit
Admission: EM | Admit: 2024-03-28 | Discharge: 2024-03-28 | Disposition: A | Discharge status: Home / Self Care | Attending: Physician Assistant | Admitting: Physician Assistant

## 2024-03-28 DIAGNOSIS — R110 Nausea: Secondary | ICD-10-CM | POA: Diagnosis not present

## 2024-03-28 DIAGNOSIS — K219 Gastro-esophageal reflux disease without esophagitis: Secondary | ICD-10-CM | POA: Diagnosis not present

## 2024-03-28 DIAGNOSIS — R1013 Epigastric pain: Secondary | ICD-10-CM

## 2024-03-28 MED ORDER — ONDANSETRON 4 MG PO TBDP: 4.0000 mg | ORAL_TABLET | Freq: Three times a day (TID) | ORAL | 0 refills | Status: AC | PRN

## 2024-03-28 MED ORDER — ONDANSETRON 4 MG PO TBDP
4.0000 mg | ORAL_TABLET | Freq: Once | ORAL | Status: AC
  Administered 2024-03-28: 4 mg via ORAL

## 2024-03-28 MED ORDER — OMEPRAZOLE 20 MG PO CPDR: 20.0000 mg | DELAYED_RELEASE_CAPSULE | Freq: Every day | ORAL | 1 refills | Status: DC | PRN

## 2024-03-28 MED ORDER — LIDOCAINE VISCOUS HCL 2 % MT SOLN
15.0000 mL | Freq: Once | OROMUCOSAL | Status: AC
  Administered 2024-03-28: 15 mL via ORAL

## 2024-03-28 MED ORDER — ALUM & MAG HYDROXIDE-SIMETH 200-200-20 MG/5ML PO SUSP
30.0000 mL | Freq: Once | ORAL | Status: AC
  Administered 2024-03-28: 30 mL via ORAL

## 2024-03-28 NOTE — Discharge Instructions (Addendum)

## 2024-03-28 NOTE — ED Triage Notes (Signed)
 Pt presents with nausea x 2 weeks. She also has epigastric pain for the past 3 days. Her last BM was 4 days ago. Pepto-Bismol helps relieve the pain.

## 2024-03-28 NOTE — ED Provider Notes (Signed)
 [facility]    CSN: [id number] Arrival date & time: 03/28/24  1651      History   Chief Complaint Chief Complaint  Patient presents with   Nausea   Abdominal Pain    HPI [name] is a 28 y.o. female presenting for 2 week history of intermittent nausea and epigastric pain as well as constipation, and increased belching/burping. Pain is worse after eating and drinking Red Bull. Works at [employer] and does report having fast foot often. No fever, fatigue, chills, back/flank pain or urinary symptoms. Denies smoking or alcohol use. Symptoms improve with pepto bismol.  HPI  Past Medical History:  Diagnosis Date   Medical history non-contributory     Patient Active Problem List   Diagnosis Date Noted   Postoperative state 10/13/2021   Encounter for planned induction of labor 10/12/2021   Uterine size date discrepancy, third trimester 10/12/2021   Morbid obesity with BMI of 50.0-59.9, adult (HCC) 10/12/2021   Obesity affecting pregnancy in third trimester 10/06/2021   Macrosomia 10/06/2021   Supervision of other high risk pregnancies, third trimester 03/04/2021    Past Surgical History:  Procedure Laterality Date   CESAREAN SECTION  10/13/2021   Procedure: CESAREAN SECTION;  Surgeon: Madelene Schanz, Joselyn Nicely, MD;  Location: [facility] ORS;  Service: Obstetrics;;   NO PAST SURGERIES     WISDOM TOOTH EXTRACTION      OB History     Gravida  2   Para  1   Term  1   Preterm      AB  1   Living  1      SAB  1   IAB      Ectopic      Multiple  0   Live Births  1            Home Medications    Prior to Admission medications   Medication Sig Start Date End Date Taking? Authorizing Provider  omeprazole (PRILOSEC) 20 MG capsule Take 1 capsule (20 mg total) by mouth daily as needed (epigastric pain/reflux). 03/28/24  Yes Nancy Axon B, PA-C  ondansetron  (ZOFRAN -ODT) 4 MG disintegrating tablet Take 1 tablet (4 mg total) by mouth every 8 (eight)  hours as needed. 03/28/24  Yes Floydene Hy, PA-C    Family History Family History  Problem Relation Age of Onset   Lupus Mother    Diabetes Maternal Aunt     Social History Social History   Tobacco Use   Smoking status: Never   Smokeless tobacco: Never  Vaping Use   Vaping status: Never Used  Substance Use Topics   Alcohol use: No   Drug use: No     Allergies   Patient has no known allergies.   Review of Systems Review of Systems  Constitutional:  Negative for chills, fatigue and fever.  Gastrointestinal:  Positive for abdominal pain, constipation, nausea and vomiting (only when she makes herself vomit to relieve abdominal pain). Negative for diarrhea.  Genitourinary:  Negative for decreased urine volume, dysuria, flank pain, frequency, hematuria, pelvic pain, urgency, vaginal bleeding, vaginal discharge and vaginal pain.  Musculoskeletal:  Negative for back pain.  Skin:  Negative for rash.     Physical Exam Triage Vital Signs ED Triage Vitals  Encounter Vitals Group     BP 03/28/24 1736 109/77     Systolic BP Percentile --      Diastolic BP Percentile --      Pulse Rate 03/28/24 1736  87     Resp 03/28/24 1736 16     Temp 03/28/24 1736 98.7 F (37.1 C)     Temp Source 03/28/24 1736 Oral     SpO2 03/28/24 1736 100 %     Weight --      Height --      Head Circumference --      Peak Flow --      Pain Score 03/28/24 1734 7     Pain Loc --      Pain Education --      Exclude from Growth Chart --    No data found.  Updated Vital Signs BP 109/77 (BP Location: Right Arm)   Pulse 87   Temp 98.7 F (37.1 C) (Oral)   Resp 16   LMP 03/27/2024   SpO2 100%      Physical Exam Vitals and nursing note reviewed.  Constitutional:      General: She is not in acute distress.    Appearance: Normal appearance. She is not ill-appearing or toxic-appearing.  HENT:     Head: Normocephalic and atraumatic.  Eyes:     General: No scleral icterus.       Right eye:  No discharge.        Left eye: No discharge.     Conjunctiva/sclera: Conjunctivae normal.  Cardiovascular:     Rate and Rhythm: Normal rate and regular rhythm.     Heart sounds: Normal heart sounds.  Pulmonary:     Effort: Pulmonary effort is normal. No respiratory distress.     Breath sounds: Normal breath sounds.  Abdominal:     Palpations: Abdomen is soft.     Tenderness: There is abdominal tenderness in the epigastric area. There is no right CVA tenderness, left CVA tenderness or guarding.  Musculoskeletal:     Cervical back: Neck supple.  Skin:    General: Skin is dry.  Neurological:     General: No focal deficit present.     Mental Status: She is alert. Mental status is at baseline.     Motor: No weakness.     Gait: Gait normal.  Psychiatric:        Mood and Affect: Mood normal.        Behavior: Behavior normal.      UC Treatments / Results  Labs (all labs ordered are listed, but only abnormal results are displayed) Labs Reviewed - No data to display  EKG   Radiology No results found.  Procedures Procedures (including critical care time)  Medications Ordered in UC Medications  alum & mag hydroxide-simeth (MAALOX/MYLANTA) 200-200-20 MG/5ML suspension 30 mL (30 mLs Oral Given 03/28/24 1809)    And  lidocaine  (XYLOCAINE ) 2 % viscous mouth solution 15 mL (15 mLs Oral Given 03/28/24 1809)  ondansetron  (ZOFRAN -ODT) disintegrating tablet 4 mg (4 mg Oral Given 03/28/24 1805)    Initial Impression / Assessment and Plan / UC Course  I have reviewed the triage vital signs and the nursing notes.  Pertinent labs & imaging results that were available during my care of the patient were reviewed by me and considered in my medical decision making (see chart for details).   28 year old female presents for epigastric abdominal pain, nausea, constipation, and reduced appetite intermittently x 2 weeks.  Symptoms worse with eating and drinking red bull.  A lot of fast food sticking  Pepto-Bismol has helped.  Reports feeling better today than she felt yesterday.  Vitals are stable and normal and she  is overall appearing.  No acute distress.  On exam has tenderness epigastric region without guarding or rebound.  No tenderness of right upper quadrant or left upper quadrant.  No other areas of tenderness.  No CVA tenderness.  Chest clear.  Patient currently on menstrual period and has an IUD.  No suspicion for pregnancy.  Presentation appears consistent with GERD.  Patient was given GI cocktail and 4 mg ODT Zofran  in clinic.  Will treat patient at this time with omeprazole and Zofran .  Discussed cutting out caffeinated beverages, fast food and heavy meals.  Encouraged bland diet at this time (until feeling better), rest and fluids.  Advised reevaluation if fever or worsening abdominal pain.   Final Clinical Impressions(s) / UC Diagnoses   Final diagnoses:  Abdominal pain, epigastric  Gastroesophageal reflux disease, unspecified whether esophagitis present  Nausea     Discharge Instructions      ABDOMINAL PAIN: You may take Tylenol  for pain relief. Use medications as directed including antiemetics and antidiarrheal medications if suggested or prescribed. You should increase fluids and electrolytes as well as rest over these next several days. If you have any questions or concerns, or if your symptoms are not improving or if especially if they acutely worsen, please call or stop back to the clinic immediately and we will be happy to help you or go to the ER   ABDOMINAL PAIN RED FLAGS: Seek immediate further care if: symptoms remain the same or worsen over the next 3-7 days, you are unable to keep fluids down, you see blood or mucus in your stool, you vomit black or dark red material, you have a fever of 101.F or higher, you have localized and/or persistent abdominal pain     ED Prescriptions     Medication Sig Dispense Auth. Provider   ondansetron  (ZOFRAN -ODT) 4 MG  disintegrating tablet Take 1 tablet (4 mg total) by mouth every 8 (eight) hours as needed. 15 tablet Nancy Axon B, PA-C   omeprazole (PRILOSEC) 20 MG capsule Take 1 capsule (20 mg total) by mouth daily as needed (epigastric pain/reflux). 30 capsule Floydene Hy, PA-C      PDMP not reviewed this encounter.   Floydene Hy, PA-C 03/28/24 [phone]

## 2024-04-13 ENCOUNTER — Ambulatory Visit
Admission: EM | Admit: 2024-04-13 | Discharge: 2024-04-13 | Disposition: A | Discharge status: Home / Self Care | Attending: Family Medicine | Admitting: Family Medicine

## 2024-04-13 ENCOUNTER — Encounter: Payer: Self-pay | Admitting: Emergency Medicine

## 2024-04-13 DIAGNOSIS — S39012A Strain of muscle, fascia and tendon of lower back, initial encounter: Secondary | ICD-10-CM | POA: Diagnosis not present

## 2024-04-13 MED ORDER — CYCLOBENZAPRINE HCL 5 MG PO TABS: 5.0000 mg | ORAL_TABLET | Freq: Three times a day (TID) | ORAL | 0 refills | Status: AC | PRN

## 2024-04-13 MED ORDER — NAPROXEN 500 MG PO TABS: 500.0000 mg | ORAL_TABLET | Freq: Two times a day (BID) | ORAL | 0 refills | Status: AC

## 2024-04-13 MED ORDER — OMEPRAZOLE 20 MG PO CPDR: 20.0000 mg | DELAYED_RELEASE_CAPSULE | Freq: Every day | ORAL | 1 refills | Status: AC | PRN

## 2024-04-13 NOTE — ED Triage Notes (Signed)
 Pt st's when she picked her daughter up 3 days ago she had a sudden sharp pain in her right lumbar region.  Pt st's she has taken OTC meds without relief

## 2024-04-13 NOTE — Discharge Instructions (Addendum)
If medication was prescribed, stop by the pharmacy to pick up your prescriptions.  For your back pain, Take 1500 mg Tylenol twice a day, take muscle relaxer (Flexeril), take Naprosyn twice a day,  as needed for pain. Consider stopping by the pharmacy or dollar store to pick up some Lidocaine patches. Apply for 12 hours and then remove.   Watch for worsening symptoms such as an increasing weakness or loss of sensation in your arms or legs, increasing pain and/or the loss of bladder or bowel function. Should any of these occur, go to the emergency department immediately.

## 2024-04-13 NOTE — ED Provider Notes (Signed)
 [facility]    CSN: [id number] Arrival date & time: 04/13/24  0857      History   Chief Complaint Chief Complaint  Patient presents with   Back Pain    HPI  HPI [name] is a 28 y.o. female.   [name] presents for right low back pain that started after picking up her 2 yo daughter who weighs 40 lbs on Tuesday.  Pain is described as pinching and without radiation. Pain rated 8/10.  Endorses numbness that is intermittent with movement. Denies weakness, tingling, dysuria, leg pain, leg weakness, abdominal pain, abdominal swelling, chest pain, incontinence, pelvic pain, perianal numbness.   Continues to have pinching pain with movement. [name] does not feel like her legs are weak. Has history of lower back pain after a fall in the freezer at work due to pain. Tried Tylenol , muscle relaxers and icy hot.   Patient's last menstrual period was 03/27/2024 (exact date). No blood in urine. No history of kidney stones.       Past Medical History:  Diagnosis Date   Medical history non-contributory     Patient Active Problem List   Diagnosis Date Noted   Postoperative state 10/13/2021   Encounter for planned induction of labor 10/12/2021   Uterine size date discrepancy, third trimester 10/12/2021   Morbid obesity with BMI of 50.0-59.9, adult (HCC) 10/12/2021   Obesity affecting pregnancy in third trimester 10/06/2021   Macrosomia 10/06/2021   Supervision of other high risk pregnancies, third trimester 03/04/2021    Past Surgical History:  Procedure Laterality Date   CESAREAN SECTION  10/13/2021   Procedure: CESAREAN SECTION;  Surgeon: Madelene Schanz, Joselyn Nicely, MD;  Location: [facility] ORS;  Service: Obstetrics;;   NO PAST SURGERIES     WISDOM TOOTH EXTRACTION      OB History     Gravida  2   Para  1   Term  1   Preterm      AB  1   Living  1      SAB  1   IAB      Ectopic      Multiple  0   Live Births  1            Home Medications     Prior to Admission medications   Medication Sig Start Date End Date Taking? Authorizing Provider  omeprazole  (PRILOSEC) 20 MG capsule Take 1 capsule (20 mg total) by mouth daily as needed (epigastric pain/reflux). 03/28/24   Floydene Hy, PA-C  ondansetron  (ZOFRAN -ODT) 4 MG disintegrating tablet Take 1 tablet (4 mg total) by mouth every 8 (eight) hours as needed. 03/28/24   Floydene Hy, PA-C    Family History Family History  Problem Relation Age of Onset   Lupus Mother    Diabetes Maternal Aunt     Social History Social History   Tobacco Use   Smoking status: Never   Smokeless tobacco: Never  Vaping Use   Vaping status: Never Used  Substance Use Topics   Alcohol use: No   Drug use: No     Allergies   Patient has no known allergies.   Review of Systems Review of Systems: egative unless otherwise stated in HPI.      Physical Exam Triage Vital Signs ED Triage Vitals [04/13/24 0911]  Encounter Vitals Group     BP 98/64     Girls Systolic BP Percentile      Girls Diastolic BP Percentile  Boys Systolic BP Percentile      Boys Diastolic BP Percentile      Pulse Rate 86     Resp 18     Temp 98.6 F (37 C)     Temp Source Oral     SpO2 100 %     Weight      Height      Head Circumference      Peak Flow      Pain Score 8     Pain Loc      Pain Education      Exclude from Growth Chart    No data found.  Updated Vital Signs BP 98/64 (BP Location: Left Arm)   Pulse 86   Temp 98.6 F (37 C) (Oral)   Resp 18   LMP 03/27/2024 (Exact Date)   SpO2 100%   Visual Acuity Right Eye Distance:   Left Eye Distance:   Bilateral Distance:    Right Eye Near:   Left Eye Near:    Bilateral Near:     Physical Exam GEN: well appearing female in no acute distress  CVS: well perfused  RESP: speaking in full sentences without pause, no respiratory distress  MSK:  Lumbar spine: - Inspection: no gross deformity or asymmetry, swelling or ecchymosis. No skin  changes  - Palpation: No TTP over the spinous processes, right lumbar paraspinal muscles TTP and hypertonicity, no SI joint tenderness bilaterally - ROM: full active ROM of the lumbar spine in flexion and extension but with mild pain with extension and right rotation  - Strength: 5/5 strength of lower extremity in L4-S1 nerve root distributions b/l - Neuro: sensation intact in the L4-S1 nerve root distribution b/l - Special testing: Negative straight leg raise bilaterally  SKIN: warm, dry, no overly skin rash or erythema    UC Treatments / Results  Labs (all labs ordered are listed, but only abnormal results are displayed) Labs Reviewed - No data to display  EKG   Radiology No results found.   Procedures Procedures (including critical care time)  Medications Ordered in UC Medications - No data to display  Initial Impression / Assessment and Plan / UC Course  I have reviewed the triage vital signs and the nursing notes.  Pertinent labs & imaging results that were available during my care of the patient were reviewed by me and considered in my medical decision making (see chart for details).      Pt is a 28 y.o.  female with *** days of *** back pain after ***.  Has history of ***low back pain.   Obtained ***lumbar plain films.  Xray personally interpreted by me were ***unremarkable for fracture, ***or significant malalignment.  Radiologist report reviewed and notes ***  Patient to gradually return to normal activities, as tolerated and continue ordinary activities within the limits permitted by pain. Prescribed Naproxen  sodium *** and muscle relaxer *** for pain relief.  Advised patient to avoid other NSAIDs while taking ***Naprosyn . Tylenol  and Lidocaine  patches PRN for multimodal pain relief. Counseled patient on red flag symptoms and when to seek immediate care.  ***No red flags suggesting cauda equina syndrome or progressive major motor weakness. Patient to follow up with  orthopedic provider if symptoms do not improve with conservative treatment.  Return and ED precautions given.    Discussed MDM, treatment plan and plan for follow-up with patient who agrees with plan.   Final Clinical Impressions(s) / UC Diagnoses   Final diagnoses:  None   Discharge Instructions   None    ED Prescriptions   None    PDMP not reviewed this encounter.

## 2024-07-04 ENCOUNTER — Ambulatory Visit
Admission: EM | Admit: 2024-07-04 | Discharge: 2024-07-04 | Disposition: A | Discharge status: Home / Self Care | Attending: Physician Assistant | Admitting: Physician Assistant

## 2024-07-04 ENCOUNTER — Encounter: Payer: Self-pay | Admitting: Emergency Medicine

## 2024-07-04 DIAGNOSIS — Z3202 Encounter for pregnancy test, result negative: Secondary | ICD-10-CM | POA: Insufficient documentation

## 2024-07-04 DIAGNOSIS — N3001 Acute cystitis with hematuria: Secondary | ICD-10-CM | POA: Insufficient documentation

## 2024-07-04 LAB — URINALYSIS, ROUTINE W REFLEX MICROSCOPIC
Glucose, UA: NEGATIVE mg/dL
Nitrite: POSITIVE — AB
Protein, ur: >300 mg/dL — AB
Specific Gravity, Urine: 1.025 (ref 1.005–1.030)
pH: 8.5 — ABNORMAL HIGH (ref 5.0–8.0)

## 2024-07-04 LAB — URINALYSIS, MICROSCOPIC (REFLEX): RBC / HPF: >50 RBC/hpf (ref 0–5)

## 2024-07-04 LAB — PREGNANCY, URINE: Preg Test, Ur: NEGATIVE

## 2024-07-04 MED ORDER — PHENAZOPYRIDINE HCL 200 MG PO TABS: 200.0000 mg | ORAL_TABLET | Freq: Three times a day (TID) | ORAL | 0 refills | Status: AC

## 2024-07-04 MED ORDER — CEPHALEXIN 500 MG PO CAPS: 500.0000 mg | ORAL_CAPSULE | Freq: Three times a day (TID) | ORAL | 0 refills | Status: AC

## 2024-07-04 NOTE — Discharge Instructions (Signed)

## 2024-07-04 NOTE — ED Provider Notes (Signed)
 [facility]    CSN: [id number] Arrival date & time: 07/04/24  0902      History   Chief Complaint Chief Complaint  Patient presents with   Dysuria   Hematuria    HPI [name] is a 28 y.o. female presenting for dysuria, bladder pressure, hematuria, urinary urgency and frequency since yesterday. Denies vaginal discharge. LMP 1 week ago. Has IUD. Denies fever, fatigue, vomiting, chills, flank pain. No OTC meds taken.  HPI  Past Medical History:  Diagnosis Date   Medical history non-contributory     Patient Active Problem List   Diagnosis Date Noted   Postoperative state 10/13/2021   Encounter for planned induction of labor 10/12/2021   Uterine size date discrepancy, third trimester 10/12/2021   Morbid obesity with BMI of 50.0-59.9, adult (HCC) 10/12/2021   Obesity affecting pregnancy in third trimester 10/06/2021   Macrosomia 10/06/2021   Supervision of other high risk pregnancies, third trimester 03/04/2021    Past Surgical History:  Procedure Laterality Date   CESAREAN SECTION  10/13/2021   Procedure: CESAREAN SECTION;  Surgeon: Lovetta, Debby PARAS, MD;  Location: [facility] ORS;  Service: Obstetrics;;   NO PAST SURGERIES     WISDOM TOOTH EXTRACTION      OB History     Gravida  2   Para  1   Term  1   Preterm      AB  1   Living  1      SAB  1   IAB      Ectopic      Multiple  0   Live Births  1            Home Medications    Prior to Admission medications   Medication Sig Start Date End Date Taking? Authorizing Provider  cephALEXin  (KEFLEX ) 500 MG capsule Take 1 capsule (500 mg total) by mouth 3 (three) times daily for 7 days. 07/04/24 07/11/24 Yes Arvis Jolan NOVAK, PA-C  phenazopyridine  (PYRIDIUM ) 200 MG tablet Take 1 tablet (200 mg total) by mouth 3 (three) times daily. 07/04/24  Yes Arvis Jolan B, PA-C  cyclobenzaprine  (FLEXERIL ) 5 MG tablet Take 1 tablet (5 mg total) by mouth 3 (three) times daily as needed. 04/13/24    Brimage, Vondra, DO  levonorgestrel  (LILETTA , 52 MG,) 20.1 MCG/DAY IUD IUD 1 each by Intrauterine route.    [provider]  naproxen  (NAPROSYN ) 500 MG tablet Take 1 tablet (500 mg total) by mouth 2 (two) times daily with a meal. 04/13/24   Brimage, Caprice, DO  omeprazole  (PRILOSEC) 20 MG capsule Take 1 capsule (20 mg total) by mouth daily as needed (epigastric pain/reflux). 04/13/24   Brimage, Vondra, DO  ondansetron  (ZOFRAN -ODT) 4 MG disintegrating tablet Take 1 tablet (4 mg total) by mouth every 8 (eight) hours as needed. 03/28/24   Arvis Jolan NOVAK, PA-C    Family History Family History  Problem Relation Age of Onset   Lupus Mother    Diabetes Maternal Aunt     Social History Social History   Tobacco Use   Smoking status: Never   Smokeless tobacco: Never  Vaping Use   Vaping status: Never Used  Substance Use Topics   Alcohol use: No   Drug use: No     Allergies   Patient has no known allergies.   Review of Systems Review of Systems  Constitutional:  Negative for chills, fatigue and fever.  Gastrointestinal:  Positive for abdominal pain. Negative for diarrhea,  nausea and vomiting.  Genitourinary:  Positive for dysuria, frequency, hematuria and urgency. Negative for decreased urine volume, flank pain, pelvic pain, vaginal bleeding, vaginal discharge and vaginal pain.  Musculoskeletal:  Negative for back pain.  Skin:  Negative for rash.     Physical Exam Triage Vital Signs ED Triage Vitals  Encounter Vitals Group     BP      Girls Systolic BP Percentile      Girls Diastolic BP Percentile      Boys Systolic BP Percentile      Boys Diastolic BP Percentile      Pulse      Resp      Temp      Temp src      SpO2      Weight      Height      Head Circumference      Peak Flow      Pain Score      Pain Loc      Pain Education      Exclude from Growth Chart    No data found.  Updated Vital Signs BP 101/68 (BP Location: Right Arm)   Pulse 82   Temp  98.6 F (37 C) (Oral)   Resp 16   Wt 292 lb (132.5 kg)   LMP 06/24/2024   SpO2 96%   BMI 48.59 kg/m      Physical Exam Vitals and nursing note reviewed.  Constitutional:      General: She is not in acute distress.    Appearance: Normal appearance. She is not ill-appearing or toxic-appearing.  HENT:     Head: Normocephalic and atraumatic.  Eyes:     General: No scleral icterus.       Right eye: No discharge.        Left eye: No discharge.     Conjunctiva/sclera: Conjunctivae normal.  Cardiovascular:     Rate and Rhythm: Normal rate and regular rhythm.     Heart sounds: Normal heart sounds.  Pulmonary:     Effort: Pulmonary effort is normal. No respiratory distress.     Breath sounds: Normal breath sounds.  Abdominal:     Palpations: Abdomen is soft.     Tenderness: There is no abdominal tenderness. There is no right CVA tenderness or left CVA tenderness.  Musculoskeletal:     Cervical back: Neck supple.  Skin:    General: Skin is dry.  Neurological:     General: No focal deficit present.     Mental Status: She is alert. Mental status is at baseline.     Motor: No weakness.     Gait: Gait normal.  Psychiatric:        Mood and Affect: Mood normal.        Behavior: Behavior normal.      UC Treatments / Results  Labs (all labs ordered are listed, but only abnormal results are displayed) Labs Reviewed  URINALYSIS, ROUTINE W REFLEX MICROSCOPIC - Abnormal; Notable for the following components:      Result Value   Color, Urine RED (*)    APPearance CLOUDY (*)    pH 8.5 (*)    Hgb urine dipstick LARGE (*)    Bilirubin Urine SMALL (*)    Ketones, ur TRACE (*)    Protein, ur >300 (*)    Nitrite POSITIVE (*)    Leukocytes,Ua SMALL (*)    All other components within normal limits  URINALYSIS, MICROSCOPIC (REFLEX) -  Abnormal; Notable for the following components:   Bacteria, UA MANY (*)    All other components within normal limits  URINE CULTURE  PREGNANCY, URINE     EKG   Radiology No results found.  Procedures Procedures (including critical care time)  Medications Ordered in UC Medications - No data to display  Initial Impression / Assessment and Plan / UC Course  I have reviewed the triage vital signs and the nursing notes.  Pertinent labs & imaging results that were available during my care of the patient were reviewed by me and considered in my medical decision making (see chart for details).   28 year old female presents for onset of dysuria, frequency, urgency, hematuria yesterday.  Has an IUD.  No history of UTIs.  Believes she might have a UTI now.  Vitals are all stable and normal.  Overall well-appearing.  No abdominal tenderness or CVA tenderness.  Urinalysis shows red cloudy urine with large hemoglobin, small bili, ketones, protein, positive nitrites, small leukocytes and many bacteria.  Will send urine for culture.  Urine pregnancy negative.  Acute cystitis with hematuria.  Treating with Keflex  and Pyridium .  Encouraged increasing rest and fluids.  Will amend treatment based on culture if needed.  Reviewed return and ER precautions.   Final Clinical Impressions(s) / UC Diagnoses   Final diagnoses:  Acute cystitis with hematuria  Negative pregnancy test     Discharge Instructions      UTI: Based on either symptoms or urinalysis, you may have a urinary tract infection. We will send the urine for culture and call with results in a few days. Begin antibiotics at this time. Your symptoms should be much improved over the next 2-3 days. Increase rest and fluid intake. If for some reason symptoms are worsening or not improving after a couple of days or the urine culture determines the antibiotics you are taking will not treat the infection, the antibiotics may be changed. Return or go to ER for fever, back pain, worsening urinary pain, discharge, increased blood in urine. May take Tylenol  or Motrin  OTC for pain relief or  consider AZO if no contraindications      ED Prescriptions     Medication Sig Dispense Auth. Provider   phenazopyridine  (PYRIDIUM ) 200 MG tablet Take 1 tablet (200 mg total) by mouth 3 (three) times daily. 6 tablet Arvis Huxley B, PA-C   cephALEXin  (KEFLEX ) 500 MG capsule Take 1 capsule (500 mg total) by mouth 3 (three) times daily for 7 days. 21 capsule Arvis Huxley NOVAK, PA-C      PDMP not reviewed this encounter.   Arvis Huxley NOVAK, PA-C 07/04/24 1055

## 2024-07-04 NOTE — ED Triage Notes (Signed)
Pt presents with dysuria and hematuria since yesterday.  °

## 2024-07-06 ENCOUNTER — Ambulatory Visit (HOSPITAL_COMMUNITY): Payer: Self-pay

## 2024-07-06 LAB — URINE CULTURE: Culture: >=100000 — AB

## 2024-07-17 ENCOUNTER — Other Ambulatory Visit
